# Patient Record
Sex: Male | Born: 1955
Health system: Southern US, Community
[De-identification: ages and names within clinical notes are randomized; demographics above are authoritative.]

## PROBLEM LIST (undated history)

## (undated) DIAGNOSIS — Z87442 Personal history of urinary calculi: Secondary | ICD-10-CM

## (undated) DIAGNOSIS — N400 Enlarged prostate without lower urinary tract symptoms: Secondary | ICD-10-CM

## (undated) DIAGNOSIS — M1990 Unspecified osteoarthritis, unspecified site: Secondary | ICD-10-CM

## (undated) DIAGNOSIS — M869 Osteomyelitis, unspecified: Secondary | ICD-10-CM

## (undated) DIAGNOSIS — E785 Hyperlipidemia, unspecified: Secondary | ICD-10-CM

## (undated) DIAGNOSIS — I10 Essential (primary) hypertension: Secondary | ICD-10-CM

## (undated) HISTORY — DX: Benign prostatic hyperplasia without lower urinary tract symptoms: N40.0

## (undated) HISTORY — PX: TONSILLECTOMY: SUR1361

## (undated) HISTORY — DX: Hyperlipidemia, unspecified: E78.5

## (undated) HISTORY — PX: JOINT REPLACEMENT: SHX530

## (undated) HISTORY — DX: Unspecified osteoarthritis, unspecified site: M19.90

---

## 2003-12-20 ENCOUNTER — Encounter: Admission: RE | Admit: 2003-12-20 | Discharge: 2003-12-20 | Payer: Self-pay | Admitting: Emergency Medicine

## 2004-01-24 ENCOUNTER — Encounter: Admission: RE | Admit: 2004-01-24 | Discharge: 2004-01-24 | Payer: Self-pay | Admitting: Emergency Medicine

## 2004-10-20 ENCOUNTER — Encounter: Admission: RE | Admit: 2004-10-20 | Discharge: 2004-10-20 | Payer: Self-pay | Admitting: Emergency Medicine

## 2004-12-22 ENCOUNTER — Ambulatory Visit (HOSPITAL_COMMUNITY): Admission: RE | Admit: 2004-12-22 | Discharge: 2004-12-22 | Payer: Self-pay | Admitting: General Surgery

## 2005-04-23 ENCOUNTER — Encounter: Admission: RE | Admit: 2005-04-23 | Discharge: 2005-04-23 | Payer: Self-pay | Admitting: Emergency Medicine

## 2005-06-06 ENCOUNTER — Emergency Department (HOSPITAL_COMMUNITY): Admission: EM | Admit: 2005-06-06 | Discharge: 2005-06-06 | Payer: Self-pay | Admitting: Emergency Medicine

## 2006-03-07 ENCOUNTER — Encounter: Admission: RE | Admit: 2006-03-07 | Discharge: 2006-03-07 | Payer: Self-pay | Admitting: Emergency Medicine

## 2006-05-13 ENCOUNTER — Emergency Department (HOSPITAL_COMMUNITY): Admission: EM | Admit: 2006-05-13 | Discharge: 2006-05-13 | Payer: Self-pay | Admitting: Emergency Medicine

## 2006-06-03 ENCOUNTER — Encounter: Admission: RE | Admit: 2006-06-03 | Discharge: 2006-06-03 | Payer: Self-pay | Admitting: Emergency Medicine

## 2007-11-06 ENCOUNTER — Ambulatory Visit: Payer: Self-pay | Admitting: Gastroenterology

## 2007-11-10 ENCOUNTER — Encounter: Admission: RE | Admit: 2007-11-10 | Discharge: 2007-11-10 | Payer: Self-pay | Admitting: Emergency Medicine

## 2007-11-19 ENCOUNTER — Ambulatory Visit: Payer: Self-pay | Admitting: Gastroenterology

## 2008-02-02 ENCOUNTER — Encounter: Admission: RE | Admit: 2008-02-02 | Discharge: 2008-02-02 | Payer: Self-pay | Admitting: Emergency Medicine

## 2008-04-13 ENCOUNTER — Ambulatory Visit (HOSPITAL_COMMUNITY): Admission: RE | Admit: 2008-04-13 | Discharge: 2008-04-14 | Payer: Self-pay | Admitting: Neurosurgery

## 2009-12-05 ENCOUNTER — Emergency Department (HOSPITAL_COMMUNITY): Admission: EM | Admit: 2009-12-05 | Discharge: 2009-12-05 | Payer: Self-pay | Admitting: Emergency Medicine

## 2010-06-08 ENCOUNTER — Inpatient Hospital Stay (HOSPITAL_COMMUNITY): Admission: RE | Admit: 2010-06-08 | Discharge: 2010-06-11 | Payer: Self-pay | Admitting: Orthopedic Surgery

## 2011-01-04 LAB — PROTIME-INR
INR: 1.14 (ref 0.00–1.49)
INR: 1.66 — ABNORMAL HIGH (ref 0.00–1.49)
Prothrombin Time: 14.8 seconds (ref 11.6–15.2)
Prothrombin Time: 30.6 seconds — ABNORMAL HIGH (ref 11.6–15.2)

## 2011-01-04 LAB — GLUCOSE, CAPILLARY
Glucose-Capillary: 109 mg/dL — ABNORMAL HIGH (ref 70–99)
Glucose-Capillary: 114 mg/dL — ABNORMAL HIGH (ref 70–99)
Glucose-Capillary: 114 mg/dL — ABNORMAL HIGH (ref 70–99)
Glucose-Capillary: 115 mg/dL — ABNORMAL HIGH (ref 70–99)
Glucose-Capillary: 117 mg/dL — ABNORMAL HIGH (ref 70–99)
Glucose-Capillary: 120 mg/dL — ABNORMAL HIGH (ref 70–99)
Glucose-Capillary: 123 mg/dL — ABNORMAL HIGH (ref 70–99)
Glucose-Capillary: 136 mg/dL — ABNORMAL HIGH (ref 70–99)
Glucose-Capillary: 150 mg/dL — ABNORMAL HIGH (ref 70–99)

## 2011-01-04 LAB — TYPE AND SCREEN
ABO/RH(D): O POS
Antibody Screen: NEGATIVE

## 2011-01-04 LAB — ABO/RH: ABO/RH(D): O POS

## 2011-01-04 LAB — HEMOGLOBIN AND HEMATOCRIT, BLOOD
HCT: 31.2 % — ABNORMAL LOW (ref 39.0–52.0)
HCT: 36.3 % — ABNORMAL LOW (ref 39.0–52.0)
Hemoglobin: 12.7 g/dL — ABNORMAL LOW (ref 13.0–17.0)

## 2011-01-05 LAB — COMPREHENSIVE METABOLIC PANEL
ALT: 17 U/L (ref 0–53)
AST: 21 U/L (ref 0–37)
Calcium: 9.2 mg/dL (ref 8.4–10.5)
GFR calc Af Amer: >60 mL/min (ref >60)
Sodium: 139 mEq/L (ref 135–145)
Total Protein: 6.6 g/dL (ref 6.0–8.3)

## 2011-01-05 LAB — DIFFERENTIAL
Eosinophils Absolute: 0.3 10*3/uL (ref 0.0–0.7)
Eosinophils Relative: 3 % (ref 0–5)
Lymphs Abs: 3.3 10*3/uL (ref 0.7–4.0)
Monocytes Relative: 5 % (ref 3–12)

## 2011-01-05 LAB — URINALYSIS, ROUTINE W REFLEX MICROSCOPIC
Ketones, ur: NEGATIVE mg/dL
Nitrite: NEGATIVE
Protein, ur: NEGATIVE mg/dL
pH: 6.5 (ref 5.0–8.0)

## 2011-01-05 LAB — MRSA CULTURE

## 2011-01-05 LAB — SURGICAL PCR SCREEN

## 2011-01-05 LAB — CBC
Hemoglobin: 13.6 g/dL (ref 13.0–17.0)
MCHC: 35 g/dL (ref 30.0–36.0)
RDW: 12.8 % (ref 11.5–15.5)

## 2011-01-05 LAB — PROTIME-INR: INR: 1.03 (ref 0.00–1.49)

## 2011-01-10 LAB — POCT I-STAT, CHEM 8
BUN: 7 mg/dL (ref 6–23)
Calcium, Ion: 1.17 mmol/L (ref 1.12–1.32)
Chloride: 105 mEq/L (ref 96–112)
Potassium: 4 mEq/L (ref 3.5–5.1)

## 2011-01-10 LAB — PROTIME-INR
INR: 1.03 (ref 0.00–1.49)
Prothrombin Time: 13.4 seconds (ref 11.6–15.2)

## 2011-01-10 LAB — APTT: aPTT: 33 seconds (ref 24–37)

## 2011-01-10 LAB — POCT CARDIAC MARKERS: Troponin i, poc: <0.05 ng/mL (ref 0.00–0.09)

## 2011-03-06 NOTE — Op Note (Signed)
[name], LANT              ACCOUNT NO.:  [id number]   MEDICAL RECORD NO.:  [id number]          PATIENT TYPE:  OIB   LOCATION:  3535                         FACILITY:  [facility]   PHYSICIAN:  Hilda Lias, M.D.   DATE OF BIRTH:  14-Mar-1956   DATE OF PROCEDURE:  04/13/2008  DATE OF DISCHARGE:                               OPERATIVE REPORT   PREOPERATIVE DIAGNOSES:  C3-C4 stenosis with myelopathy, gliosis of the  spinal cord, and status post fusion C4-C7 11 years ago.   POSTOPERATIVE DIAGNOSES:  C3-C4 stenosis with myelopathy, gliosis of the  spinal cord, and status post fusion C4-C7 11 years ago.   PROCEDURE:  Anterior C3-C4 diskectomy, decompression of the spinal cord,  foraminotomy, interbody fusion using Zero plate, microscope, and  autograft.   SURGEON:  Hilda Lias, MD.   ASSISTANT:  Coletta Memos, MD.   CLINICAL HISTORY:  Mr. Bagot is a gentleman who underwent fusion 11  years ago at the level of C4-C7.  Now, he had been complaining of  weakness,  difficulty walking, and x-rays show severe stenosis at the C4  with changes in the spinal cord.  Surgery was advised.   PROCEDURE:  The patient was taken to the OR and after intubation, the  left side of the neck was cleaned with DuraPrep.  A transverse incision  was done through the skin and subcutaneous tissue.  Dissection was  carried out.  The patient has a large submaximal gland.  Retraction was  made, and we were able to dissect until we saw the upper part of the  plate.  Immediately, we opened the anterior ligament and with a  microscope, we did a total gross diskectomy.  The patient had quite a  bit of disruption of the posterior ligament with fragment going into the  spinal cord itself.  Decompression was achieved.  Foraminotomy was done.  Then, the endplates were drilled.  No regular plate was used because  there was almost no more space for new plate.  Then, we went ahead and  we proceeded with the insertion  of 5-0 Zero plate about 9 mm with  autograft and bone markers inside.  Four screws were inserted.  Lateral  cervical spine showed good position of the plate and screws.  From then  on, the area was irrigated.  We accomplished good hemostasis.  Then, the  wound was closed with Vicryl and Steri-Strips.           ______________________________  Hilda Lias, M.D.     EB/MEDQ  D:  04/13/2008  T:  04/14/2008  Job:  [id number]

## 2011-03-06 NOTE — H&P (Signed)
[name], [name]              ACCOUNT NO.:  [id number]   MEDICAL RECORD NO.:  [id number]          PATIENT TYPE:  OIB   LOCATION:  3535                         FACILITY:  [facility]   PHYSICIAN:  Hilda Lias, M.D.   DATE OF BIRTH:  July 04, 1956   DATE OF ADMISSION:  04/13/2008  DATE OF DISCHARGE:                              HISTORY & PHYSICAL   ROOM NO:  Pending.   HISTORY:  The patient is a gentleman who about 11 years ago underwent  cervical fusion from C4 down to C7.  He came to my office about 8 weeks  ago complaining of weakness, numbness in hands, and numbness in the legs  for more than 18 months, which is getting worse for the past 5 months.  He noticed the hands and the legs are quite numb and he has problem with  balance.  He had an MRI, and he has seen [place] for evaluation.   PAST MEDICAL HISTORY:  Anterior cervical fusion in 1998 from C3 down to  C7 and right knee surgery.  He is not allergic to any medication.   SOCIAL HISTORY:  Does not drink.  He smokes a pack of cigarette.   FAMILY HISTORY:  Unremarkable.   REVIEW OF SYSTEMS:  Positive for arthritis, neck pain, weakness, and  high cholesterol.   PHYSICAL EXAMINATION:  HEAD, EARS, NOSE, AND THROAT:  Normal.  NECK:  He has a scar anteriorly.  He has some discomfort with  lateralization.  LUNGS:  Clear.  HEART:  Sounds are normal.  EXTREMITIES:  Normal pulses.  NEURO:  He has hyperflexion with Babinski on the right side.  He has  numbness, which mostly compromise the upper and lower extremity, and he  has difficulty walking in a straight line.   DIAGNOSTIC IMAGING:  The MRI showed that he has severe stenosis at the  level of L3-L4 with gliosis within the spinal cord itself.  The lumbar  spine x-ray showed lumbar stenosis from L2 down to L5. Clinically, the  patient had C3-C4 herniated disk stenosis with cervical myelopathy and  gliosis.  Lumbar stenosis.   RECOMMENDATIONS:  The patient is being admitted for  surgery.  The  procedure would be anterior diskectomy at the level of L3-L4 with  decompression of the spinal cord.  He knows about the risks including  paralysis, weakness, no improve whatsoever, damage to the esophagus,  damage to the trachea, and also the numbness that may not improve  secondary to the damage of the spinal cord itself.           ______________________________  Hilda Lias, M.D.    EB/MEDQ  D:  04/13/2008  T:  04/14/2008  Job:  [id number]

## 2011-03-09 NOTE — Op Note (Signed)
[name], [name]              ACCOUNT NO.:  [id number]   MEDICAL RECORD NO.:  [id number]          PATIENT TYPE:  AMB   LOCATION:  DAY                          FACILITY:  [facility]   PHYSICIAN:  Adolph Pollack, M.D.DATE OF BIRTH:  Nov 10, 1955   DATE OF PROCEDURE:  DATE OF DISCHARGE:                                 OPERATIVE REPORT   PREOPERATIVE DIAGNOSES:  Bilateral inguinal hernias with right side being  recurrent.   POSTOPERATIVE DIAGNOSES:  Bilateral inguinal hernias with right side being  recurrent.   PROCEDURE:  Laparoscopic repair of bilateral inguinal hernias with right  side being recurrent.   SURGEON:  Adolph Pollack, M.D.   ANESTHESIA:  General.   INDICATIONS:  This 55 year old male is having some pain in his groins was  found to have a left inguinal hernia by Dr. Tresa Watson. [name] was sent to our office  and both Dr. Zachery Watson and I palpated a recurrent right inguinal hernia.  [name]  now presents for laparoscopic repair of his hernias.   The procedure and the risks were discussed with him preoperatively.   TECHNIQUE:  [name] was seen in the holding area and then brought to the  operating room, placed supine on the operating room and general anesthetic  was administered. Before coming to the operating, [name] voided. The lower  abdominal wall and groin hair was clipped. The area was then sterilely  prepped and draped. Dilute Marcaine solution was infiltrated in the  subumbilical region and a small transverse subumbilical incision was made  through the skin and subcutaneous tissue. Using blunt dissection, I  exposed  the left anterior rectus sheath and made a small incision in it. The  underlying rectus muscle was swept laterally exposing the posterior rectus  sheath. A balloon dissection device was then placed in the extraperitoneal  space and under laparoscopic vision, the wound dissection was performed.   The balloon dissection device was removed and trocars placed in  the  extraperitoneal space.  CO2 gas was insufflated. Under laparoscopic vision,  two 5 mm trocars were placed in the lower midline. Using blunt dissection,  Cooper's ligament was exposed bilaterally. Beginning on the left side in  order to try and reduce the extraperitoneal fatty contents from it. I then  dissected the fibrofatty tissue away from the anterior and lateral abdominal  walls using blunt dissection up to the level of the umbilicus. The spermatic  cord was isolated and a window created around it. The peritoneum was  stripped from the cord back to the level of the umbilicus. No indirect  hernia was noted, however.   I then approached the right side. I noted what appeared to be recurrent  direct hernia and I reduced some of the fatty contents from this. I then  used blunt dissection to dissect the fibrofatty tissue away from the  anterior and lateral abdominal walls. The peritoneum was stripped off the  cord to the level of the umbilicus. A very small tear was made in the  peritoneum and I did not think this needed to be repaired. We did get a  pneumoperitoneum and I inserted a Veress needle just next to the umbilical  trocar to evacuate some of the pneumoperitoneum. This allowed for continued  good vision of the extraperitoneal space.   A 5 x 6 inch piece of mesh was then placed into the left extraperitoneal  space with a partial longitudinal slit cut into it. It was positioned  appropriately with the two tails wrapped around the cord. The mesh then  anchored to Cooper's ligament, the anterior and lateral abdominal walls with  spiral tacks. This provided for adequate coverage of the direct and indirect  internal spaces.   A similar size piece of mesh with a partial longitudinal slit cut into it  was then placed the right extraperitoneal space in position. It was anchored  to Cooper's ligament, the anterior and lateral abdominal walls with spiral  tacks. The two tails were  wrapped around the cord. It more than adequately  covered the femoral,  indirect and direct spaces.   Following this, I held the inferior lateral aspect of both pieces of mesh  down with a blunt instrument and released the CO2 gas. I then removed the  instruments and the trocars. The left anterior rectus sheath defect was  closed with her interrupted #0 Vicryl suture.  The skin incisions were  closed with 4-0 Monocryl subcuticular stitches followed by Steri-Strips and  sterile dressings.   [name] tolerated the procedure well without apparent complications and was taken  to the recovery in satisfactory condition.      TJR/MEDQ  D:  12/22/2004  T:  12/22/2004  Job:  [id number]   cc:   [name], M.D.  [address]  [place]  [place] [address]  Fax: [phone]

## 2011-05-16 IMAGING — CR DG PORTABLE PELVIS
1 series · 1 of 1 positions shown · non-contrast
Comparison: None.

CLINICAL DATA: Left hip replacement

PORTABLE PELVIS

[series [date]]
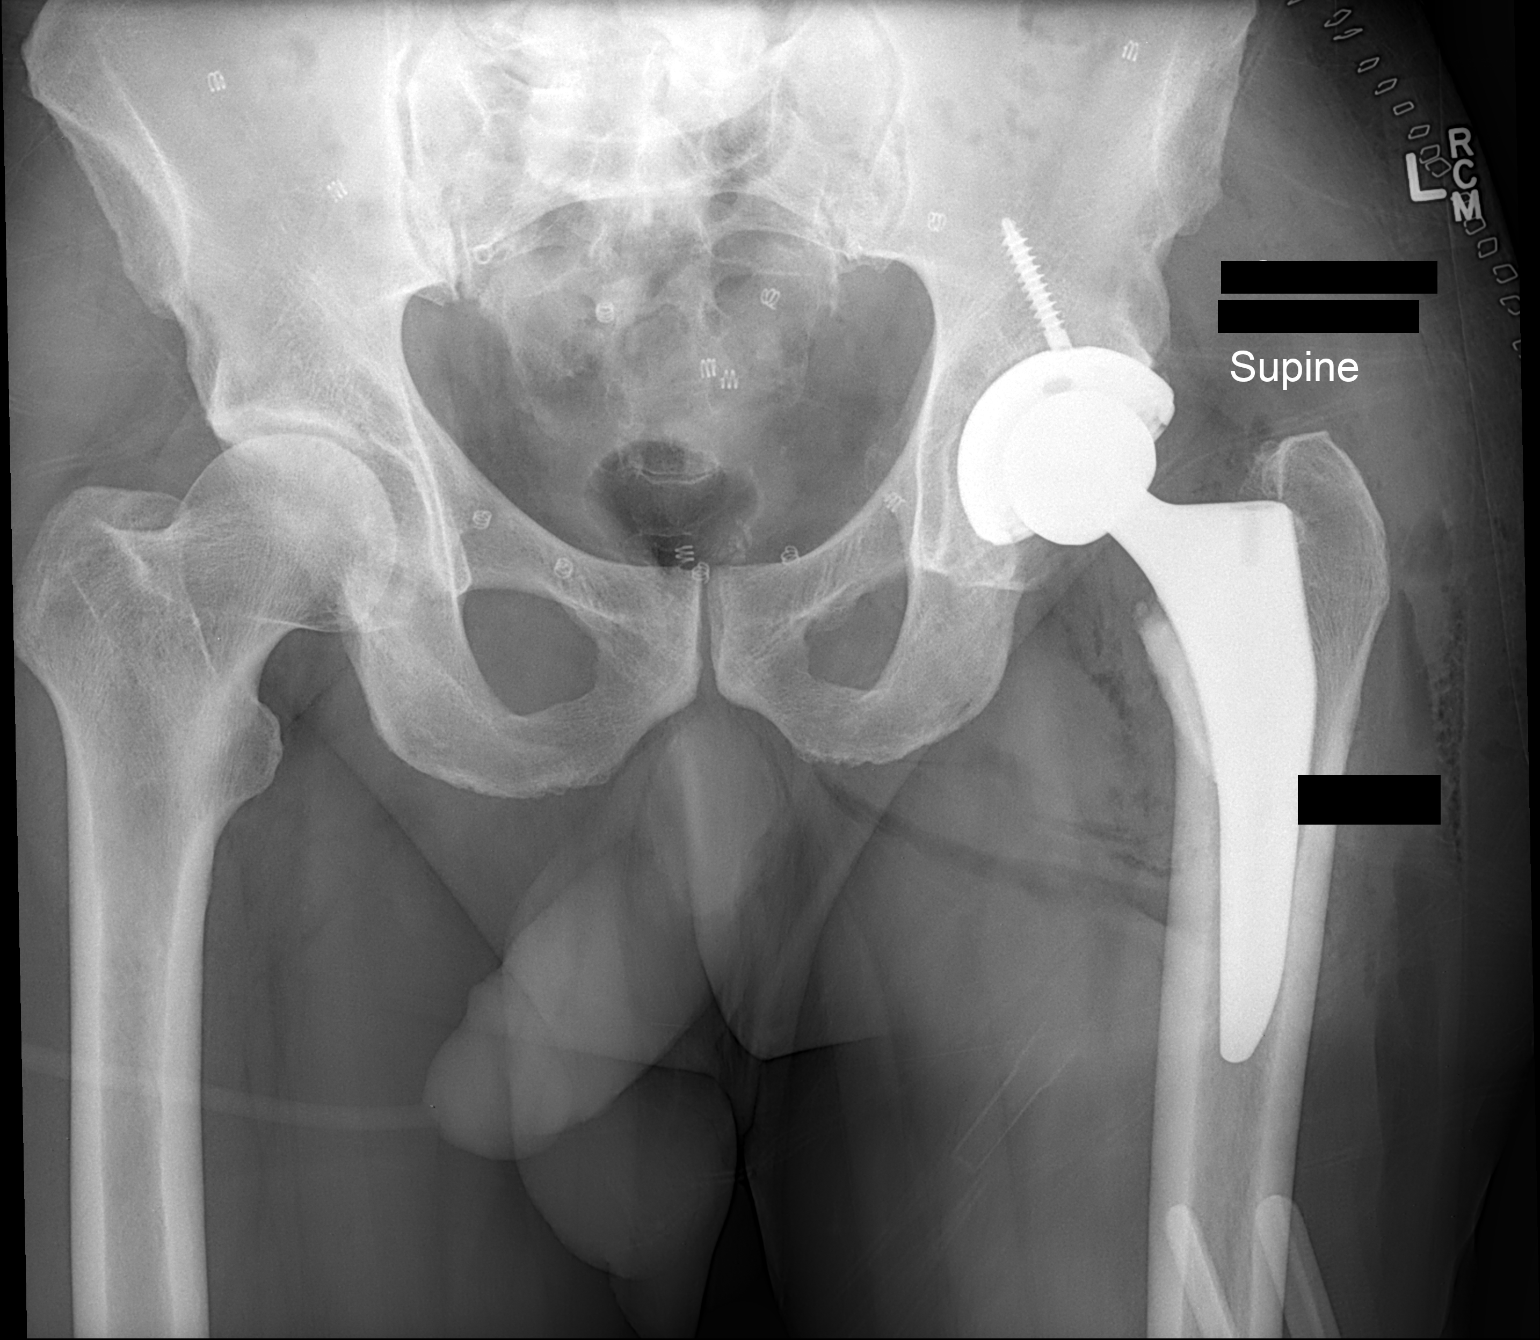

[1 of 1 positions shown; findings below may reference images not displayed]

FINDINGS: There is a left total hip arthroplasty.  Femoral
component and acetabular component well seated.  No evidence of
fracture.
IMPRESSION: No evidence of complication following left hip arthroplasty.

## 2011-07-19 LAB — CBC
HCT: 41.3
Hemoglobin: 14.8
MCHC: 35.7
MCV: 92.6
Platelets: 270
RBC: 4.47
RDW: 12.3
WBC: 12 — ABNORMAL HIGH

## 2011-07-19 LAB — BASIC METABOLIC PANEL
BUN: 10
Calcium: 9.2
GFR calc non Af Amer: >60
Glucose, Bld: 100 — ABNORMAL HIGH
Potassium: 4.1

## 2015-08-26 ENCOUNTER — Encounter: Payer: Self-pay | Admitting: Gastroenterology

## 2016-07-31 NOTE — Progress Notes (Signed)
Surgery on 08/15/2016 .  Need orders in EPIC.  Thank You.

## 2016-08-10 ENCOUNTER — Encounter (HOSPITAL_COMMUNITY)
Admission: RE | Admit: 2016-08-10 | Discharge: 2016-08-10 | Disposition: A | Discharge status: Home / Self Care | Payer: BLUE CROSS/BLUE SHIELD | Source: Ambulatory Visit | Attending: Orthopedic Surgery | Admitting: Orthopedic Surgery

## 2016-08-10 ENCOUNTER — Encounter (HOSPITAL_COMMUNITY): Payer: Self-pay

## 2016-08-10 DIAGNOSIS — Z87442 Personal history of urinary calculi: Secondary | ICD-10-CM | POA: Diagnosis not present

## 2016-08-10 DIAGNOSIS — M1710 Unilateral primary osteoarthritis, unspecified knee: Secondary | ICD-10-CM | POA: Insufficient documentation

## 2016-08-10 DIAGNOSIS — Z01812 Encounter for preprocedural laboratory examination: Secondary | ICD-10-CM | POA: Insufficient documentation

## 2016-08-10 DIAGNOSIS — Z01818 Encounter for other preprocedural examination: Secondary | ICD-10-CM

## 2016-08-10 HISTORY — DX: Osteomyelitis, unspecified: M86.9

## 2016-08-10 HISTORY — DX: Unspecified osteoarthritis, unspecified site: M19.90

## 2016-08-10 HISTORY — DX: Personal history of urinary calculi: Z87.442

## 2016-08-10 LAB — PROTIME-INR
INR: 0.96
Prothrombin Time: 12.8 seconds (ref 11.4–15.2)

## 2016-08-10 LAB — COMPREHENSIVE METABOLIC PANEL
ALT: 22 U/L (ref 17–63)
AST: 19 U/L (ref 15–41)
Albumin: 4.1 g/dL (ref 3.5–5.0)
Alkaline Phosphatase: 81 U/L (ref 38–126)
Anion gap: 6 (ref 5–15)
BUN: 10 mg/dL (ref 6–20)
CO2: 24 mmol/L (ref 22–32)
Calcium: 9.3 mg/dL (ref 8.9–10.3)
Chloride: 107 mmol/L (ref 101–111)
Creatinine, Ser: 0.9 mg/dL (ref 0.61–1.24)
GFR calc Af Amer: >60 mL/min (ref >60)
GFR calc non Af Amer: >60 mL/min (ref >60)
Glucose, Bld: 98 mg/dL (ref 65–99)
Potassium: 3.8 mmol/L (ref 3.5–5.1)
Sodium: 137 mmol/L (ref 135–145)
Total Bilirubin: 0.7 mg/dL (ref 0.3–1.2)
Total Protein: 6.9 g/dL (ref 6.5–8.1)

## 2016-08-10 LAB — CBC WITH DIFFERENTIAL/PLATELET
Basophils Absolute: 0 10*3/uL (ref 0.0–0.1)
Basophils Relative: 0 %
Eosinophils Absolute: 0.2 10*3/uL (ref 0.0–0.7)
Eosinophils Relative: 2 %
HCT: 43.5 % (ref 39.0–52.0)
Hemoglobin: 15.4 g/dL (ref 13.0–17.0)
Lymphocytes Relative: 36 %
Lymphs Abs: 3.2 10*3/uL (ref 0.7–4.0)
MCH: 33.9 pg (ref 26.0–34.0)
MCHC: 35.4 g/dL (ref 30.0–36.0)
MCV: 95.8 fL (ref 78.0–100.0)
Monocytes Absolute: 0.8 10*3/uL (ref 0.1–1.0)
Monocytes Relative: 9 %
Neutro Abs: 4.9 10*3/uL (ref 1.7–7.7)
Neutrophils Relative %: 53 %
Platelets: 212 10*3/uL (ref 150–400)
RBC: 4.54 MIL/uL (ref 4.22–5.81)
RDW: 12 % (ref 11.5–15.5)
WBC: 9.1 10*3/uL (ref 4.0–10.5)

## 2016-08-10 LAB — SURGICAL PCR SCREEN
MRSA, PCR: NEGATIVE
Staphylococcus aureus: NEGATIVE

## 2016-08-10 LAB — URINALYSIS, ROUTINE W REFLEX MICROSCOPIC
Bilirubin Urine: NEGATIVE
Glucose, UA: NEGATIVE mg/dL
Ketones, ur: NEGATIVE mg/dL
Leukocytes, UA: NEGATIVE
Nitrite: NEGATIVE
Protein, ur: NEGATIVE mg/dL
Specific Gravity, Urine: 1.017 (ref 1.005–1.030)
pH: 7 (ref 5.0–8.0)

## 2016-08-10 LAB — URINE MICROSCOPIC-ADD ON
Bacteria, UA: NONE SEEN
Squamous Epithelial / LPF: NONE SEEN

## 2016-08-10 LAB — APTT: aPTT: 30 seconds (ref 24–36)

## 2016-08-10 NOTE — Progress Notes (Signed)
U/A and micro done 08/10/16 faxed via EPIC to dr Gladstone Lighter.

## 2016-08-10 NOTE — Progress Notes (Signed)
06/04/16- [place]- dr Dagmar Hait on chart  LOV, EKG and CXR from 06/04/16 on chart

## 2016-08-10 NOTE — Patient Instructions (Addendum)
[name]  08/10/2016   Your procedure is scheduled on: 08/15/2016    Report to [facility] Main  Entrance take [place]  elevators to 3rd floor to  [place] at   [place] AM.  Call this number if you have problems the morning of surgery [phone]   Remember: ONLY 1 PERSON MAY GO WITH YOU TO SHORT STAY TO GET  READY MORNING OF [place].  Do not eat food or drink liquids :After Midnight.     Take these medicines the morning of surgery with A SIP OF WATER: none  DO NOT TAKE ANY DIABETIC MEDICATIONS DAY OF YOUR SURGERY                               You may not have any metal on your body including hair pins and              piercings  Do not wear jewelry, , lotions, powders or perfumes, deodorant                          Men may shave face and neck.   Do not bring valuables to the hospital. [place].  Contacts, dentures or bridgework may not be worn into surgery.  Leave suitcase in the car. After surgery it may be brought to your room.    Special Instructions: N/A              Please read over the following fact sheets you were given: _____________________________________________________________________             [facility] - Preparing for Surgery Before surgery, you can play an important role.  Because skin is not sterile, your skin needs to be as free of germs as possible.  You can reduce the number of germs on your skin by washing with CHG (chlorahexidine gluconate) soap before surgery.  CHG is an antiseptic cleaner which kills germs and bonds with the skin to continue killing germs even after washing. Please DO NOT use if you have an allergy to CHG or antibacterial soaps.  If your skin becomes reddened/irritated stop using the CHG and inform your nurse when you arrive at Short Stay. Do not shave (including legs and underarms) for at least 48 hours prior to the first CHG shower.  You may  shave your face/neck. Please follow these instructions carefully:  1.  Shower with CHG Soap the night before surgery and the  morning of Surgery.  2.  If you choose to wash your hair, wash your hair first as usual with your  normal  shampoo.  3.  After you shampoo, rinse your hair and body thoroughly to remove the  shampoo.                           4.  Use CHG as you would any other liquid soap.  You can apply chg directly  to the skin and wash                       Gently with a scrungie or clean washcloth.  5.  Apply the CHG  Soap to your body ONLY FROM THE NECK DOWN.   Do not use on face/ open                           Wound or open sores. Avoid contact with eyes, ears mouth and genitals (private parts).                       Wash face,  Genitals (private parts) with your normal soap.             6.  Wash thoroughly, paying special attention to the area where your surgery  will be performed.  7.  Thoroughly rinse your body with warm water from the neck down.  8.  DO NOT shower/wash with your normal soap after using and rinsing off  the CHG Soap.                9.  Pat yourself dry with a clean towel.            10.  Wear clean pajamas.            11.  Place clean sheets on your bed the night of your first shower and do not  sleep with pets. Day of Surgery : Do not apply any lotions/deodorants the morning of surgery.  Please wear clean clothes to the hospital/surgery center.  FAILURE TO FOLLOW THESE INSTRUCTIONS MAY RESULT IN THE CANCELLATION OF YOUR SURGERY PATIENT SIGNATURE_________________________________  NURSE SIGNATURE__________________________________  ________________________________________________________________________  WHAT IS A BLOOD TRANSFUSION? Blood Transfusion Information  A transfusion is the replacement of blood or some of its parts. Blood is made up of multiple cells which provide different functions.  Red blood cells carry oxygen and are used for blood loss  replacement.  White blood cells fight against infection.  Platelets control bleeding.  Plasma helps clot blood.  Other blood products are available for specialized needs, such as hemophilia or other clotting disorders. BEFORE THE TRANSFUSION  Who gives blood for transfusions?   Healthy volunteers who are fully evaluated to make sure their blood is safe. This is blood bank blood. Transfusion therapy is the safest it has ever been in the practice of medicine. Before blood is taken from a donor, a complete history is taken to make sure that person has no history of diseases nor engages in risky social behavior (examples are intravenous drug use or sexual activity with multiple partners). The donor's travel history is screened to minimize risk of transmitting infections, such as malaria. The donated blood is tested for signs of infectious diseases, such as HIV and hepatitis. The blood is then tested to be sure it is compatible with you in order to minimize the chance of a transfusion reaction. If you or a relative donates blood, this is often done in anticipation of surgery and is not appropriate for emergency situations. It takes many days to process the donated blood. RISKS AND COMPLICATIONS Although transfusion therapy is very safe and saves many lives, the main dangers of transfusion include:   Getting an infectious disease.  Developing a transfusion reaction. This is an allergic reaction to something in the blood you were given. Every precaution is taken to prevent this. The decision to have a blood transfusion has been considered carefully by your caregiver before blood is given. Blood is not given unless the benefits outweigh the risks. AFTER THE TRANSFUSION  Right after receiving a blood transfusion, you  will usually feel much better and more energetic. This is especially true if your red blood cells have gotten low (anemic). The transfusion raises the level of the red blood cells which  carry oxygen, and this usually causes an energy increase.  The nurse administering the transfusion will monitor you carefully for complications. HOME CARE INSTRUCTIONS  No special instructions are needed after a transfusion. You may find your energy is better. Speak with your caregiver about any limitations on activity for underlying diseases you may have. SEEK MEDICAL CARE IF:   Your condition is not improving after your transfusion.  You develop redness or irritation at the intravenous (IV) site. SEEK IMMEDIATE MEDICAL CARE IF:  Any of the following symptoms occur over the next 12 hours:  Shaking chills.  You have a temperature by mouth above 102 F (38.9 C), not controlled by medicine.  Chest, back, or muscle pain.  People around you feel you are not acting correctly or are confused.  Shortness of breath or difficulty breathing.  Dizziness and fainting.  You get a rash or develop hives.  You have a decrease in urine output.  Your urine turns a dark color or changes to pink, red, or brown. Any of the following symptoms occur over the next 10 days:  You have a temperature by mouth above 102 F (38.9 C), not controlled by medicine.  Shortness of breath.  Weakness after normal activity.  The white part of the eye turns yellow (jaundice).  You have a decrease in the amount of urine or are urinating less often.  Your urine turns a dark color or changes to pink, red, or brown. Document Released: 10/05/2000 Document Revised: 12/31/2011 Document Reviewed: 05/24/2008 ExitCare Patient Information 2014 [place].  _______________________________________________________________________  Incentive Spirometer  An incentive spirometer is a tool that can help keep your lungs clear and active. This tool measures how well you are filling your lungs with each breath. Taking long deep breaths may help reverse or decrease the chance of developing breathing (pulmonary) problems  (especially infection) following:  A long period of time when you are unable to move or be active. BEFORE THE PROCEDURE   If the spirometer includes an indicator to show your best effort, your nurse or respiratory therapist will set it to a desired goal.  If possible, sit up straight or lean slightly forward. Try not to slouch.  Hold the incentive spirometer in an upright position. INSTRUCTIONS FOR USE  1. Sit on the edge of your bed if possible, or sit up as far as you can in bed or on a chair. 2. Hold the incentive spirometer in an upright position. 3. Breathe out normally. 4. Place the mouthpiece in your mouth and seal your lips tightly around it. 5. Breathe in slowly and as deeply as possible, raising the piston or the ball toward the top of the column. 6. Hold your breath for 3-5 seconds or for as long as possible. Allow the piston or ball to fall to the bottom of the column. 7. Remove the mouthpiece from your mouth and breathe out normally. 8. Rest for a few seconds and repeat Steps 1 through 7 at least 10 times every 1-2 hours when you are awake. Take your time and take a few normal breaths between deep breaths. 9. The spirometer may include an indicator to show your best effort. Use the indicator as a goal to work toward during each repetition. 10. After each set of 10 deep breaths, practice  coughing to be sure your lungs are clear. If you have an incision (the cut made at the time of surgery), support your incision when coughing by placing a pillow or rolled up towels firmly against it. Once you are able to get out of bed, walk around indoors and cough well. You may stop using the incentive spirometer when instructed by your caregiver.  RISKS AND COMPLICATIONS  Take your time so you do not get dizzy or light-headed.  If you are in pain, you may need to take or ask for pain medication before doing incentive spirometry. It is harder to take a deep breath if you are having  pain. AFTER USE  Rest and breathe slowly and easily.  It can be helpful to keep track of a log of your progress. Your caregiver can provide you with a simple table to help with this. If you are using the spirometer at home, follow these instructions: [place] IF:   You are having difficultly using the spirometer.  You have trouble using the spirometer as often as instructed.  Your pain medication is not giving enough relief while using the spirometer.  You develop fever of 100.5 F (38.1 C) or higher. SEEK IMMEDIATE MEDICAL CARE IF:   You cough up bloody sputum that had not been present before.  You develop fever of 102 F (38.9 C) or greater.  You develop worsening pain at or near the incision site. MAKE SURE YOU:   Understand these instructions.  Will watch your condition.  Will get help right away if you are not doing well or get worse. Document Released: 02/18/2007 Document Revised: 12/31/2011 Document Reviewed: 04/21/2007 [facility] Patient Information 2014 [place], [place].   ________________________________________________________________________

## 2016-08-14 NOTE — H&P (Signed)
TOTAL KNEE ADMISSION H&P  Patient is being admitted for right total knee arthroplasty.  Subjective:  Chief Complaint:right knee pain.  HPI: [name], 60 y.o. male, has a history of pain and functional disability in the right knee due to arthritis and has failed non-surgical conservative treatments for greater than 12 weeks to includeNSAID's and/or analgesics, corticosteriod injections, flexibility and strengthening excercises and activity modification.  Onset of symptoms was gradual, starting 2 years ago with gradually worsening course since that time. The patient noted no past surgery on the right knee(s).  Patient currently rates pain in the right knee(s) at 8 out of 10 with activity. Patient has night pain, worsening of pain with activity and weight bearing, pain that interferes with activities of daily living, pain with passive range of motion, crepitus and joint swelling.  Patient has evidence of periarticular osteophytes and joint space narrowing by imaging studies. There is no active infection.   Past Medical History:  Diagnosis Date  . Arthritis   . Bone infection [facility])    age 30 - ? staph   . History of kidney stones    as a teenager     Past Surgical History:  Procedure Laterality Date  . JOINT REPLACEMENT     left hip - 20008      Current Outpatient Prescriptions:  .  ibuprofen (ADVIL,MOTRIN) 200 MG tablet, Take 600 mg by mouth 2 (two) times daily., Disp: , Rfl:    Social History  Substance Use Topics  . Smoking status: Current Every Day Smoker    Packs/day: 0.50    Years: 40.00  . Smokeless tobacco: Never Used  . Alcohol use Yes     Comment: glass of wine on weekends       Review of Systems  Constitutional: Negative.   HENT: Negative.   Eyes: Negative.   Respiratory: Negative.   Cardiovascular: Negative.   Gastrointestinal: Negative.   Genitourinary: Negative.   Musculoskeletal: Positive for joint pain and myalgias. Negative for back pain, falls and  neck pain.  Skin: Negative.   Neurological: Negative.   Endo/Heme/Allergies: Negative.   Psychiatric/Behavioral: Negative.     Objective:  Physical Exam  Constitutional: He is oriented to person, place, and time. He appears well-developed. No distress.  Overweight  HENT:  Head: Normocephalic and atraumatic.  Right Ear: External ear normal.  Left Ear: External ear normal.  Nose: Nose normal.  Mouth/Throat: Oropharynx is clear and moist.  Eyes: Conjunctivae and EOM are normal.  Neck: Normal range of motion. Neck supple.  Cardiovascular: Normal rate, regular rhythm, normal heart sounds and intact distal pulses.   No murmur heard. Respiratory: Effort normal and breath sounds normal. No respiratory distress. He has no wheezes.  GI: Soft. Bowel sounds are normal. He exhibits no distension. There is no tenderness.  Musculoskeletal:       Right hip: Normal.       Left hip: Normal.       Right knee: He exhibits decreased range of motion and swelling. He exhibits no effusion. Tenderness found. Medial joint line and lateral joint line tenderness noted.       Left knee: Normal.  Neurological: He is alert and oriented to person, place, and time. He has normal strength. No sensory deficit.  Skin: No rash noted. He is not diaphoretic. No erythema.  Psychiatric: He has a normal mood and affect. His behavior is normal.    Vitals  Weight: 217 lb Height: 68in Pulse: 64 (Regular)  BP: 150/80 (Sitting, Left Arm, Standard)  Imaging Review Plain radiographs demonstrate severe degenerative joint disease of the right knee(s). The overall alignment ismild varus. The bone quality appears to be good for age and reported activity level.  Assessment/Plan:  End stage primary osteoarthritis, right knee   The patient history, physical examination, clinical judgment of the provider and imaging studies are consistent with end stage degenerative joint disease of the right knee(s) and total knee  arthroplasty is deemed medically necessary. The treatment options including medical management, injection therapy arthroscopy and arthroplasty were discussed at length. The risks and benefits of total knee arthroplasty were presented and reviewed. The risks due to aseptic loosening, infection, stiffness, patella tracking problems, thromboembolic complications and other imponderables were discussed. The patient acknowledged the explanation, agreed to proceed with the plan and consent was signed. Patient is being admitted for inpatient treatment for surgery, pain control, PT, OT, prophylactic antibiotics, VTE prophylaxis, progressive ambulation and ADL's and discharge planning. The patient is planning to be discharged home with home health services   PCP: Dr. Dagmar Hait Therapy Plans: HHPT then outpatient at [facility] with girlfriend and sister   Ardeen Jourdain, [place]

## 2016-08-15 ENCOUNTER — Inpatient Hospital Stay (HOSPITAL_COMMUNITY): Payer: BLUE CROSS/BLUE SHIELD | Admitting: Anesthesiology

## 2016-08-15 ENCOUNTER — Inpatient Hospital Stay (HOSPITAL_COMMUNITY)
Admission: RE | Admit: 2016-08-15 | Discharge: 2016-08-17 | DRG: 470 | Disposition: A | Discharge status: Home Health | Payer: BLUE CROSS/BLUE SHIELD | Source: Ambulatory Visit | Attending: Orthopedic Surgery | Admitting: Orthopedic Surgery

## 2016-08-15 ENCOUNTER — Encounter (HOSPITAL_COMMUNITY)
Admission: RE | Disposition: A | Discharge status: Home Health | Payer: Self-pay | Source: Ambulatory Visit | Attending: Orthopedic Surgery

## 2016-08-15 ENCOUNTER — Encounter (HOSPITAL_COMMUNITY): Payer: Self-pay

## 2016-08-15 DIAGNOSIS — M24561 Contracture, right knee: Secondary | ICD-10-CM | POA: Diagnosis present

## 2016-08-15 DIAGNOSIS — Z96642 Presence of left artificial hip joint: Secondary | ICD-10-CM | POA: Diagnosis present

## 2016-08-15 DIAGNOSIS — Z87442 Personal history of urinary calculi: Secondary | ICD-10-CM

## 2016-08-15 DIAGNOSIS — Z791 Long term (current) use of non-steroidal anti-inflammatories (NSAID): Secondary | ICD-10-CM

## 2016-08-15 DIAGNOSIS — F17210 Nicotine dependence, cigarettes, uncomplicated: Secondary | ICD-10-CM | POA: Diagnosis present

## 2016-08-15 DIAGNOSIS — M1711 Unilateral primary osteoarthritis, right knee: Principal | ICD-10-CM | POA: Diagnosis present

## 2016-08-15 DIAGNOSIS — Z96651 Presence of right artificial knee joint: Secondary | ICD-10-CM

## 2016-08-15 HISTORY — PX: TOTAL KNEE ARTHROPLASTY: SHX125

## 2016-08-15 LAB — TYPE AND SCREEN
ABO/RH(D): O POS
Antibody Screen: NEGATIVE

## 2016-08-15 SURGERY — ARTHROPLASTY, KNEE, TOTAL
Anesthesia: Spinal | Laterality: Right

## 2016-08-15 MED ORDER — MIDAZOLAM HCL 5 MG/5ML IJ SOLN
INTRAMUSCULAR | Status: DC | PRN
  Administered 2016-08-15: 2 mg via INTRAVENOUS

## 2016-08-15 MED ORDER — HYDROMORPHONE HCL 1 MG/ML IJ SOLN: 0.2500 mg | INTRAMUSCULAR | Status: DC | PRN

## 2016-08-15 MED ORDER — ONDANSETRON HCL 4 MG/2ML IJ SOLN
INTRAMUSCULAR | Status: AC
  Filled 2016-08-15: qty 2

## 2016-08-15 MED ORDER — LIDOCAINE 2% (20 MG/ML) 5 ML SYRINGE
INTRAMUSCULAR | Status: AC
  Filled 2016-08-15: qty 5

## 2016-08-15 MED ORDER — MIDAZOLAM HCL 2 MG/2ML IJ SOLN
INTRAMUSCULAR | Status: AC
  Filled 2016-08-15: qty 2

## 2016-08-15 MED ORDER — LACTATED RINGERS IV SOLN
INTRAVENOUS | Status: DC
  Administered 2016-08-15 – 2016-08-16 (×2): via INTRAVENOUS

## 2016-08-15 MED ORDER — SODIUM CHLORIDE 0.9 % IJ SOLN
INTRAMUSCULAR | Status: DC | PRN
  Administered 2016-08-15: 20 mL

## 2016-08-15 MED ORDER — HYDROMORPHONE HCL 1 MG/ML IJ SOLN
1.0000 mg | INTRAMUSCULAR | Status: DC | PRN
  Administered 2016-08-15 – 2016-08-16 (×3): 1 mg via INTRAVENOUS
  Filled 2016-08-15 (×3): qty 1

## 2016-08-15 MED ORDER — ONDANSETRON HCL 4 MG PO TABS: 4.0000 mg | ORAL_TABLET | Freq: Four times a day (QID) | ORAL | Status: DC | PRN

## 2016-08-15 MED ORDER — ONDANSETRON HCL 4 MG/2ML IJ SOLN
INTRAMUSCULAR | Status: DC | PRN
  Administered 2016-08-15: 4 mg via INTRAVENOUS

## 2016-08-15 MED ORDER — CEFAZOLIN IN D5W 1 GM/50ML IV SOLN
1.0000 g | Freq: Four times a day (QID) | INTRAVENOUS | Status: AC
  Administered 2016-08-15 (×2): 1 g via INTRAVENOUS
  Filled 2016-08-15 (×2): qty 50

## 2016-08-15 MED ORDER — ONDANSETRON HCL 4 MG/2ML IJ SOLN: 4.0000 mg | Freq: Four times a day (QID) | INTRAMUSCULAR | Status: DC | PRN

## 2016-08-15 MED ORDER — BUPIVACAINE HCL (PF) 0.25 % IJ SOLN
INTRAMUSCULAR | Status: DC | PRN
  Administered 2016-08-15: 20 mL

## 2016-08-15 MED ORDER — CEFAZOLIN SODIUM-DEXTROSE 2-4 GM/100ML-% IV SOLN
2.0000 g | INTRAVENOUS | Status: AC
  Administered 2016-08-15: 2 g via INTRAVENOUS
  Filled 2016-08-15: qty 100

## 2016-08-15 MED ORDER — FENTANYL CITRATE (PF) 100 MCG/2ML IJ SOLN
INTRAMUSCULAR | Status: AC
  Filled 2016-08-15: qty 2

## 2016-08-15 MED ORDER — FERROUS SULFATE 325 (65 FE) MG PO TABS
325.0000 mg | ORAL_TABLET | Freq: Three times a day (TID) | ORAL | Status: DC
  Administered 2016-08-15 – 2016-08-17 (×5): 325 mg via ORAL
  Filled 2016-08-15 (×5): qty 1

## 2016-08-15 MED ORDER — BISACODYL 5 MG PO TBEC: 5.0000 mg | DELAYED_RELEASE_TABLET | Freq: Every day | ORAL | Status: DC | PRN

## 2016-08-15 MED ORDER — HYDROCODONE-ACETAMINOPHEN 5-325 MG PO TABS
1.0000 | ORAL_TABLET | ORAL | Status: DC | PRN
  Administered 2016-08-15 – 2016-08-16 (×3): 2 via ORAL
  Filled 2016-08-15 (×3): qty 2

## 2016-08-15 MED ORDER — PROPOFOL 10 MG/ML IV BOLUS
INTRAVENOUS | Status: AC
  Filled 2016-08-15: qty 20

## 2016-08-15 MED ORDER — SUCCINYLCHOLINE CHLORIDE 20 MG/ML IJ SOLN
INTRAMUSCULAR | Status: AC
  Filled 2016-08-15: qty 1

## 2016-08-15 MED ORDER — ALUM & MAG HYDROXIDE-SIMETH 200-200-20 MG/5ML PO SUSP: 30.0000 mL | ORAL | Status: DC | PRN

## 2016-08-15 MED ORDER — FENTANYL CITRATE (PF) 100 MCG/2ML IJ SOLN
INTRAMUSCULAR | Status: DC | PRN
  Administered 2016-08-15: 100 ug via INTRAVENOUS

## 2016-08-15 MED ORDER — BUPIVACAINE LIPOSOME 1.3 % IJ SUSP
INTRAMUSCULAR | Status: DC | PRN
  Administered 2016-08-15: 20 mL

## 2016-08-15 MED ORDER — CHLORHEXIDINE GLUCONATE 4 % EX LIQD: 60.0000 mL | Freq: Once | CUTANEOUS | Status: DC

## 2016-08-15 MED ORDER — BUPIVACAINE LIPOSOME 1.3 % IJ SUSP
20.0000 mL | Freq: Once | INTRAMUSCULAR | Status: DC
  Filled 2016-08-15: qty 20

## 2016-08-15 MED ORDER — SODIUM CHLORIDE 0.9 % IR SOLN
Status: DC | PRN
  Administered 2016-08-15: 500 mL

## 2016-08-15 MED ORDER — LACTATED RINGERS IV SOLN
INTRAVENOUS | Status: DC
  Administered 2016-08-15 (×3): via INTRAVENOUS

## 2016-08-15 MED ORDER — METHOCARBAMOL 1000 MG/10ML IJ SOLN
500.0000 mg | Freq: Four times a day (QID) | INTRAVENOUS | Status: DC | PRN
  Filled 2016-08-15: qty 5

## 2016-08-15 MED ORDER — ROCURONIUM BROMIDE 50 MG/5ML IV SOSY
PREFILLED_SYRINGE | INTRAVENOUS | Status: AC
  Filled 2016-08-15: qty 5

## 2016-08-15 MED ORDER — ACETAMINOPHEN 650 MG RE SUPP: 650.0000 mg | Freq: Four times a day (QID) | RECTAL | Status: DC | PRN

## 2016-08-15 MED ORDER — SODIUM CHLORIDE 0.9 % IJ SOLN
INTRAMUSCULAR | Status: AC
  Filled 2016-08-15: qty 20

## 2016-08-15 MED ORDER — BUPIVACAINE HCL (PF) 0.75 % IJ SOLN
INTRAMUSCULAR | Status: DC | PRN
  Administered 2016-08-15: 15 mg via INTRATHECAL

## 2016-08-15 MED ORDER — PROPOFOL 10 MG/ML IV BOLUS
INTRAVENOUS | Status: AC
  Filled 2016-08-15: qty 40

## 2016-08-15 MED ORDER — SODIUM CHLORIDE 0.9 % IR SOLN
Status: AC
  Filled 2016-08-15: qty 500000

## 2016-08-15 MED ORDER — PROMETHAZINE HCL 25 MG/ML IJ SOLN: 6.2500 mg | INTRAMUSCULAR | Status: DC | PRN

## 2016-08-15 MED ORDER — ACETAMINOPHEN 325 MG PO TABS: 650.0000 mg | ORAL_TABLET | Freq: Four times a day (QID) | ORAL | Status: DC | PRN

## 2016-08-15 MED ORDER — POLYETHYLENE GLYCOL 3350 17 G PO PACK: 17.0000 g | PACK | Freq: Every day | ORAL | Status: DC | PRN

## 2016-08-15 MED ORDER — FLEET ENEMA 7-19 GM/118ML RE ENEM: 1.0000 | ENEMA | Freq: Once | RECTAL | Status: DC | PRN

## 2016-08-15 MED ORDER — DEXAMETHASONE SODIUM PHOSPHATE 10 MG/ML IJ SOLN
INTRAMUSCULAR | Status: DC | PRN
  Administered 2016-08-15: 10 mg via INTRAVENOUS

## 2016-08-15 MED ORDER — PHENOL 1.4 % MT LIQD: 1.0000 | OROMUCOSAL | Status: DC | PRN

## 2016-08-15 MED ORDER — MENTHOL 3 MG MT LOZG
1.0000 | LOZENGE | OROMUCOSAL | Status: DC | PRN
Start: 2016-08-15 — End: 2016-08-17

## 2016-08-15 MED ORDER — PROPOFOL 500 MG/50ML IV EMUL
INTRAVENOUS | Status: DC | PRN
  Administered 2016-08-15: 100 ug/kg/min via INTRAVENOUS

## 2016-08-15 MED ORDER — RIVAROXABAN 10 MG PO TABS
10.0000 mg | ORAL_TABLET | Freq: Every day | ORAL | Status: DC
  Administered 2016-08-16 – 2016-08-17 (×2): 10 mg via ORAL
  Filled 2016-08-15 (×2): qty 1

## 2016-08-15 MED ORDER — CEFAZOLIN SODIUM-DEXTROSE 2-4 GM/100ML-% IV SOLN
INTRAVENOUS | Status: AC
  Filled 2016-08-15: qty 100

## 2016-08-15 MED ORDER — CELECOXIB 200 MG PO CAPS
200.0000 mg | ORAL_CAPSULE | Freq: Two times a day (BID) | ORAL | Status: DC
  Administered 2016-08-15 – 2016-08-17 (×5): 200 mg via ORAL
  Filled 2016-08-15 (×5): qty 1

## 2016-08-15 MED ORDER — BUPIVACAINE HCL (PF) 0.25 % IJ SOLN
INTRAMUSCULAR | Status: AC
  Filled 2016-08-15: qty 30

## 2016-08-15 MED ORDER — SODIUM CHLORIDE 0.9 % IV SOLN
1000.0000 mg | INTRAVENOUS | Status: AC
  Administered 2016-08-15: 1000 mg via INTRAVENOUS
  Filled 2016-08-15: qty 1100

## 2016-08-15 MED ORDER — METHOCARBAMOL 500 MG PO TABS
500.0000 mg | ORAL_TABLET | Freq: Four times a day (QID) | ORAL | Status: DC | PRN
  Administered 2016-08-15 – 2016-08-17 (×4): 500 mg via ORAL
  Filled 2016-08-15 (×4): qty 1

## 2016-08-15 MED ORDER — OXYCODONE-ACETAMINOPHEN 5-325 MG PO TABS
2.0000 | ORAL_TABLET | ORAL | Status: DC | PRN
  Administered 2016-08-15 – 2016-08-16 (×3): 2 via ORAL
  Filled 2016-08-15 (×3): qty 2

## 2016-08-15 MED ORDER — DEXAMETHASONE SODIUM PHOSPHATE 10 MG/ML IJ SOLN
INTRAMUSCULAR | Status: AC
  Filled 2016-08-15: qty 1

## 2016-08-15 SURGICAL SUPPLY — 64 items
BAG DECANTER FOR FLEXI CONT (MISCELLANEOUS) IMPLANT
BAG ZIPLOCK 12X15 (MISCELLANEOUS) IMPLANT
BANDAGE ACE 4X5 VEL STRL LF (GAUZE/BANDAGES/DRESSINGS) ×3 IMPLANT
BANDAGE ACE 6X5 VEL STRL LF (GAUZE/BANDAGES/DRESSINGS) ×3 IMPLANT
BANDAGE ELASTIC 4 VELCRO ST LF (GAUZE/BANDAGES/DRESSINGS) ×3 IMPLANT
BLADE SAG 18X100X1.27 (BLADE) ×3 IMPLANT
BLADE SAW SGTL 11.0X1.19X90.0M (BLADE) ×3 IMPLANT
BNDG COHESIVE 4X5 TAN NS LF (GAUZE/BANDAGES/DRESSINGS) ×6 IMPLANT
BNDG GAUZE ELAST 4 BULKY (GAUZE/BANDAGES/DRESSINGS) ×3 IMPLANT
BONE CEMENT GENTAMICIN (Cement) ×6 IMPLANT
CAP KNEE TOTAL 3 SIGMA ×3 IMPLANT
CEMENT BONE GENTAMICIN 40 (Cement) ×2 IMPLANT
CLOSURE WOUND 1/2 X4 (GAUZE/BANDAGES/DRESSINGS) ×1
CLOTH BEACON ORANGE TIMEOUT ST (SAFETY) ×3 IMPLANT
CUFF TOURN SGL QUICK 34 (TOURNIQUET CUFF) ×2
CUFF TRNQT CYL 34X4X40X1 (TOURNIQUET CUFF) ×1 IMPLANT
DECANTER SPIKE VIAL GLASS SM (MISCELLANEOUS) ×3 IMPLANT
DRAPE INCISE IOBAN 66X45 STRL (DRAPES) IMPLANT
DRAPE U-SHAPE 47X51 STRL (DRAPES) ×3 IMPLANT
DRSG ADAPTIC 3X8 NADH LF (GAUZE/BANDAGES/DRESSINGS) ×3 IMPLANT
DRSG PAD ABDOMINAL 8X10 ST (GAUZE/BANDAGES/DRESSINGS) ×6 IMPLANT
DURAPREP 26ML APPLICATOR (WOUND CARE) ×3 IMPLANT
ELECT REM PT RETURN 9FT ADLT (ELECTROSURGICAL) ×3
ELECTRODE REM PT RTRN 9FT ADLT (ELECTROSURGICAL) ×1 IMPLANT
EVACUATOR 1/8 PVC DRAIN (DRAIN) ×3 IMPLANT
FACESHIELD WRAPAROUND (MASK) ×3 IMPLANT
GAUZE SPONGE 4X4 12PLY STRL (GAUZE/BANDAGES/DRESSINGS) ×3 IMPLANT
GLOVE BIOGEL PI IND STRL 6.5 (GLOVE) ×1 IMPLANT
GLOVE BIOGEL PI IND STRL 8 (GLOVE) ×1 IMPLANT
GLOVE BIOGEL PI INDICATOR 6.5 (GLOVE) ×2
GLOVE BIOGEL PI INDICATOR 8 (GLOVE) ×2
GLOVE ECLIPSE 8.0 STRL XLNG CF (GLOVE) ×6 IMPLANT
GLOVE SURG SS PI 6.5 STRL IVOR (GLOVE) ×3 IMPLANT
GOWN STRL REUS W/TWL LRG LVL3 (GOWN DISPOSABLE) ×3 IMPLANT
GOWN STRL REUS W/TWL XL LVL3 (GOWN DISPOSABLE) ×3 IMPLANT
HANDPIECE INTERPULSE COAX TIP (DISPOSABLE) ×2
HEMOSTAT SPONGE AVITENE ULTRA (HEMOSTASIS) ×3 IMPLANT
IMMOBILIZER KNEE 20 (SOFTGOODS) ×3
IMMOBILIZER KNEE 20 THIGH 36 (SOFTGOODS) ×1 IMPLANT
MANIFOLD NEPTUNE II (INSTRUMENTS) ×3 IMPLANT
NEEDLE HYPO 21X1.5 SAFETY (NEEDLE) ×3 IMPLANT
NEEDLE HYPO 22GX1.5 SAFETY (NEEDLE) ×3 IMPLANT
NS IRRIG 1000ML POUR BTL (IV SOLUTION) IMPLANT
PACK TOTAL KNEE CUSTOM (KITS) ×3 IMPLANT
PAD ABD 8X10 STRL (GAUZE/BANDAGES/DRESSINGS) ×3 IMPLANT
PENCIL SMOKE EVAC W/HOLSTER (ELECTROSURGICAL) ×3 IMPLANT
POSITIONER SURGICAL ARM (MISCELLANEOUS) ×3 IMPLANT
SET HNDPC FAN SPRY TIP SCT (DISPOSABLE) ×1 IMPLANT
SET PAD KNEE POSITIONER (MISCELLANEOUS) ×3 IMPLANT
SPONGE LAP 18X18 X RAY DECT (DISPOSABLE) IMPLANT
STRIP CLOSURE SKIN 1/2X4 (GAUZE/BANDAGES/DRESSINGS) ×2 IMPLANT
SUT BONE WAX W31G (SUTURE) IMPLANT
SUT MNCRL AB 4-0 PS2 18 (SUTURE) ×3 IMPLANT
SUT VIC AB 1 CT1 27 (SUTURE) ×4
SUT VIC AB 1 CT1 27XBRD ANTBC (SUTURE) ×2 IMPLANT
SUT VIC AB 2-0 CT1 27 (SUTURE) ×6
SUT VIC AB 2-0 CT1 TAPERPNT 27 (SUTURE) ×3 IMPLANT
SUT VLOC 180 0 24IN GS25 (SUTURE) ×3 IMPLANT
SYR 20CC LL (SYRINGE) ×6 IMPLANT
TOWER CARTRIDGE SMART MIX (DISPOSABLE) ×3 IMPLANT
TRAY FOLEY W/METER SILVER 16FR (SET/KITS/TRAYS/PACK) ×3 IMPLANT
WATER STERILE IRR 1500ML POUR (IV SOLUTION) ×3 IMPLANT
WRAP KNEE MAXI GEL POST OP (GAUZE/BANDAGES/DRESSINGS) ×3 IMPLANT
YANKAUER SUCT BULB TIP 10FT TU (MISCELLANEOUS) ×3 IMPLANT

## 2016-08-15 NOTE — Anesthesia Preprocedure Evaluation (Signed)
Anesthesia Evaluation  Patient identified by MRN, date of birth, ID band Patient awake    Reviewed: Allergy & Precautions, NPO status , Patient's Chart, lab work & pertinent test results  Airway Mallampati: II  TM Distance: >3 FB Neck ROM: Full    Dental no notable dental hx.    Pulmonary neg pulmonary ROS, Current Smoker,    Pulmonary exam normal breath sounds clear to auscultation       Cardiovascular negative cardio ROS Normal cardiovascular exam Rhythm:Regular Rate:Normal     Neuro/Psych negative neurological ROS  negative psych ROS   GI/Hepatic negative GI ROS, Neg liver ROS,   Endo/Other  negative endocrine ROS  Renal/GU negative Renal ROS  negative genitourinary   Musculoskeletal  (+) Arthritis ,   Abdominal (+) + obese,   Peds negative pediatric ROS (+)  Hematology negative hematology ROS (+)   Anesthesia Other Findings   Reproductive/Obstetrics negative OB ROS                             Anesthesia Physical Anesthesia Plan  ASA: II  Anesthesia Plan: Spinal   Post-op Pain Management:    Induction: Intravenous  Airway Management Planned: Natural Airway  Additional Equipment:   Intra-op Plan: Utilization Of Total Body Hypothermia per surgeon request  Post-operative Plan:   Informed Consent: I have reviewed the patients History and Physical, chart, labs and discussed the procedure including the risks, benefits and alternatives for the proposed anesthesia with the patient or authorized representative who has indicated his/her understanding and acceptance.     Plan Discussed with: CRNA  Anesthesia Plan Comments: (Discussed risks and benefits of and differences between spinal and general. Discussed risks of spinal including headache, backache, failure, bleeding and hematoma, infection, and nerve damage. Patient consents to spinal. Questions answered. Coagulation studies  and platelet count acceptable.)        Anesthesia Quick Evaluation

## 2016-08-15 NOTE — Anesthesia Postprocedure Evaluation (Signed)
Anesthesia Post Note  Patient: [name]  Procedure(s) Performed: Procedure(s) (LRB): TOTAL KNEE ARTHROPLASTY (Right)  Patient location during evaluation: PACU Anesthesia Type: Spinal Level of consciousness: oriented and awake and alert Pain management: pain level controlled Vital Signs Assessment: post-procedure vital signs reviewed and stable Respiratory status: spontaneous breathing, respiratory function stable and patient connected to nasal cannula oxygen Cardiovascular status: blood pressure returned to baseline and stable Postop Assessment: no headache, no backache, spinal receding and patient able to bend at knees Anesthetic complications: no    Last Vitals:  Vitals:   08/15/16 1145 08/15/16 1208  BP: (!) 147/93 (!) 145/76  Pulse: (!) 55 (!) 55  Resp: 15 12  Temp: 36.4 C 36.8 C    Last Pain:  Vitals:   08/15/16 1145  TempSrc:   PainSc: 0-No pain                 Destry Dauber J

## 2016-08-15 NOTE — Interval H&P Note (Signed)
History and Physical Interval Note:  08/15/2016 7:17 AM  [name]  has presented today for surgery, with the diagnosis of RIGHT KNEE OA  The various methods of treatment have been discussed with the patient and family. After consideration of risks, benefits and other options for treatment, the patient has consented to  Procedure(s): TOTAL KNEE ARTHROPLASTY (Right) as a surgical intervention .  The patient's history has been reviewed, patient examined, no change in status, stable for surgery.  I have reviewed the patient's chart and labs.  Questions were answered to the patient's satisfaction.     Henri Guedes A

## 2016-08-15 NOTE — Discharge Instructions (Signed)

## 2016-08-15 NOTE — Transfer of Care (Signed)
Immediate Anesthesia Transfer of Care Note  Patient: [name]  Procedure(s) Performed: Procedure(s): TOTAL KNEE ARTHROPLASTY (Right)  Patient Location: PACU  Anesthesia Type:Spinal  Level of Consciousness: sedated  Airway & Oxygen Therapy: Patient Spontanous Breathing and Patient connected to face mask oxygen  Post-op Assessment: Report given to RN and Post -op Vital signs reviewed and stable  Post vital signs: Reviewed and stable  Last Vitals:  Vitals:   08/15/16 0630  BP: (!) 152/72  Pulse: 72  Resp: 18  Temp: 36.8 C    Last Pain:  Vitals:   08/15/16 0630  TempSrc: Oral  PainSc:          Complications: No apparent anesthesia complications

## 2016-08-15 NOTE — Progress Notes (Signed)
Dr. Delma Post in to see patient.

## 2016-08-15 NOTE — Progress Notes (Signed)
Dr. Delma Post made aware of patient's heart rates 42-52- and blood pressures- also made aware patient very sleepy-arousable- has periods of apnea

## 2016-08-15 NOTE — Brief Op Note (Signed)
08/15/2016  9:08 AM  PATIENT:  Abdulhamid L Napoles  60 y.o. male  PRE-OPERATIVE DIAGNOSIS:  RIGHT KNEE Primary  OA with Flexion Contracture.  POST-OPERATIVE DIAGNOSIS:  RIGHT KNEE Primary  OA with Flexion Contracture.  PROCEDURE:  Procedure(s): TOTAL KNEE ARTHROPLASTY (Right) and release of Flexion Contractures.  SURGEON:  Surgeon(s) and Role:    * Latanya Maudlin, MD - Primary  PHYSICIAN ASSISTANT: Ardeen Jourdain PA  ASSISTANTS: Ardeen Jourdain PA  ANESTHESIA:   spinal  EBL:  Total I/O In: 1000 [I.V.:1000] Out: 200 [Urine:200]  BLOOD ADMINISTERED:none  DRAINS: (one) Hemovact drain(s) in the Right Knee with  Suction Open   LOCAL MEDICATIONS USED:  MARCAINE 20cc of 0.50% plain and Exparel 20cc mixed with 20cc of Normal Saline.    SPECIMEN:  No Specimen  DISPOSITION OF SPECIMEN:  N/A  COUNTS:  YES  TOURNIQUET:  * Missing tourniquet times found for documented tourniquets in log:  [id number] *  DICTATION: .Other Dictation: Dictation Number [id number]  PLAN OF CARE: Admit to inpatient   PATIENT DISPOSITION:  Stable in OR   Delay start of Pharmacological VTE agent (>24hrs) due to surgical blood loss or risk of bleeding: yes

## 2016-08-15 NOTE — Anesthesia Procedure Notes (Addendum)
Spinal  Patient location during procedure: OR Start time: 08/15/2016 7:30 AM End time: 08/15/2016 7:32 AM Staffing Resident/CRNA: Harle Stanford R Performed: resident/CRNA  Preanesthetic Checklist Completed: patient identified, site marked, surgical consent, pre-op evaluation, timeout performed, IV checked, risks and benefits discussed and monitors and equipment checked Spinal Block Patient position: sitting Prep: Betadine Patient monitoring: heart rate, cardiac monitor, continuous pulse ox and blood pressure Approach: midline Location: L3-4 Injection technique: single-shot Needle Needle type: Pencan  Needle gauge: 24 G Needle length: 10 cm Needle insertion depth: 7 cm Assessment Sensory level: T6 Additional Notes Timeout performed. SAB kit date checked. SAB without difficulty

## 2016-08-15 NOTE — Evaluation (Signed)
Physical Therapy Evaluation Patient Details Name: KY CLUM MRN: [id number] DOB: 12/27/1955 Today's Date: 08/15/2016   History of Present Illness  Pt s/p R TKR and with hx of L THR (08)  Clinical Impression  Pt s/p R TKR presents with decreased R LE strength/ROM and post op pain limiting functional mobility.  Pt should progress to dc home with assist of "Joy" and HHPT follow up.    Follow Up Recommendations Home health PT    Equipment Recommendations  None recommended by PT    Recommendations for Other Services OT consult     Precautions / Restrictions Precautions Precautions: Knee;Fall Required Braces or Orthoses: Knee Immobilizer - Right Knee Immobilizer - Right: Discontinue once straight leg raise with < 10 degree lag Restrictions Weight Bearing Restrictions: No Other Position/Activity Restrictions: WBAT      Mobility  Bed Mobility Overal bed mobility: Needs Assistance Bed Mobility: Supine to Sit     Supine to sit: Min assist     General bed mobility comments: cues for sequence and use of L LE to self assist  Transfers Overall transfer level: Needs assistance Equipment used: Rolling walker (2 wheeled) Transfers: Sit to/from Stand Sit to Stand: Min guard         General transfer comment: cues for LE management and use of UEs to self assist  Ambulation/Gait Ambulation/Gait assistance: Min assist Ambulation Distance (Feet): 48 Feet Assistive device: Rolling walker (2 wheeled) Gait Pattern/deviations: Step-to pattern;Decreased step length - right;Decreased step length - left;Shuffle;Trunk flexed Gait velocity: decr Gait velocity interpretation: Below normal speed for age/gender General Gait Details: cues for sequence, posture and position from [employer]            Wheelchair Mobility    Modified Rankin (Stroke Patients Only)       Balance                                             Pertinent Vitals/Pain Pain  Assessment: 0-10 Pain Score: 10-Worst pain ever Pain Location: R knee Pain Descriptors / Indicators: Aching;Sore Pain Intervention(s): Limited activity within patient's tolerance;Monitored during session;Premedicated before session;Ice applied    Home Living Family/patient expects to be discharged to:: Private residence Living Arrangements: Alone Available Help at Discharge: Friend(s) Type of Home: House Home Access: Stairs to enter Entrance Stairs-Rails: Psychiatric nurse of Steps: 3 Home Layout: One level Home Equipment: Environmental consultant - 2 wheels      Prior Function Level of Independence: Independent               Hand Dominance        Extremity/Trunk Assessment   Upper Extremity Assessment: Overall WFL for tasks assessed           Lower Extremity Assessment: RLE deficits/detail      Cervical / Trunk Assessment: Normal  Communication   Communication: No difficulties  Cognition Arousal/Alertness: Awake/alert Behavior During Therapy: WFL for tasks assessed/performed Overall Cognitive Status: Within Functional Limits for tasks assessed                      General Comments      Exercises Total Joint Exercises Ankle Circles/Pumps: AROM;Both;15 reps;Supine   Assessment/Plan    PT Assessment Patient needs continued PT services  PT Problem List Decreased strength;Decreased range of motion;Decreased activity tolerance;Decreased mobility;Decreased knowledge of use of DME;Decreased  safety awareness;Pain;Obesity          PT Treatment Interventions DME instruction;Gait training;Stair training;Functional mobility training;Therapeutic activities;Therapeutic exercise;Patient/family education    PT Goals (Current goals can be found in the Care Plan section)  Acute Rehab PT Goals Patient Stated Goal: Regain IND PT Goal Formulation: With patient Time For Goal Achievement: 08/18/16 Potential to Achieve Goals: Good    Frequency 7X/week    Barriers to discharge        Co-evaluation               End of Session Equipment Utilized During Treatment: Gait belt;Right knee immobilizer Activity Tolerance: Patient tolerated treatment well Patient left: in chair;with call bell/phone within reach;with family/visitor present Nurse Communication: Mobility status         Time: [id number] PT Time Calculation (min) (ACUTE ONLY): 23 min   Charges:   PT Evaluation $PT Eval Low Complexity: 1 Procedure PT Treatments $Gait Training: 8-22 mins   PT G Codes:        Toneisha Savary Aug 22, 2016, 5:15 PM

## 2016-08-16 LAB — BASIC METABOLIC PANEL
Anion gap: 5 (ref 5–15)
BUN: 11 mg/dL (ref 6–20)
CHLORIDE: 103 mmol/L (ref 101–111)
CO2: 26 mmol/L (ref 22–32)
CREATININE: 0.85 mg/dL (ref 0.61–1.24)
Calcium: 9.1 mg/dL (ref 8.9–10.3)
GFR calc Af Amer: >60 mL/min (ref >60)
GFR calc non Af Amer: >60 mL/min (ref >60)
GLUCOSE: 150 mg/dL — AB (ref 65–99)
Potassium: 4.2 mmol/L (ref 3.5–5.1)
SODIUM: 134 mmol/L — AB (ref 135–145)

## 2016-08-16 LAB — CBC
HCT: 38.5 % — ABNORMAL LOW (ref 39.0–52.0)
HEMOGLOBIN: 13 g/dL (ref 13.0–17.0)
MCH: 33.2 pg (ref 26.0–34.0)
MCHC: 33.8 g/dL (ref 30.0–36.0)
MCV: 98.5 fL (ref 78.0–100.0)
PLATELETS: 229 10*3/uL (ref 150–400)
RBC: 3.91 MIL/uL — ABNORMAL LOW (ref 4.22–5.81)
RDW: 11.9 % (ref 11.5–15.5)
WBC: 17 10*3/uL — ABNORMAL HIGH (ref 4.0–10.5)

## 2016-08-16 MED ORDER — OXYCODONE-ACETAMINOPHEN 5-325 MG PO TABS: 1.0000 | ORAL_TABLET | ORAL | 0 refills | Status: DC | PRN

## 2016-08-16 MED ORDER — ZOLPIDEM TARTRATE 10 MG PO TABS
10.0000 mg | ORAL_TABLET | Freq: Every evening | ORAL | Status: DC | PRN
  Administered 2016-08-16: 10 mg via ORAL
  Filled 2016-08-16 (×3): qty 1

## 2016-08-16 MED ORDER — METHOCARBAMOL 500 MG PO TABS: 500.0000 mg | ORAL_TABLET | Freq: Four times a day (QID) | ORAL | 1 refills | Status: DC | PRN

## 2016-08-16 MED ORDER — OXYCODONE HCL 5 MG PO TABS
5.0000 mg | ORAL_TABLET | ORAL | Status: DC | PRN
  Administered 2016-08-16 – 2016-08-17 (×7): 10 mg via ORAL
  Filled 2016-08-16 (×6): qty 2
  Filled 2016-08-16: qty 3

## 2016-08-16 NOTE — Op Note (Signed)
[name], [name]              ACCOUNT NO.:  [id number]  MEDICAL RECORD NO.:  [id number]  LOCATION:  [id number]                         FACILITY:  [facility]  PHYSICIAN:  Kipp Brood. Destina Mantei, M.D.DATE OF BIRTH:  07-16-1956  DATE OF PROCEDURE:  08/15/2016 DATE OF DISCHARGE:                              OPERATIVE REPORT   SURGEON:  Kipp Brood. Gladstone Lighter, M.D.  OPERATIVE ASSISTANT:  Ardeen Jourdain, PA.  PREOPERATIVE DIAGNOSES: 1. Flexion contracture, right knee. 2. Primary osteoarthritis with bone on bone, right knee.  POSTOPERATIVE DIAGNOSES: 1. Flexion contracture, right knee. 2. Primary osteoarthritis with bone on bone, right knee.  OPERATION:  Right total knee arthroplasty utilizing the DePuy system, all three components were cemented.  Gentamicin was used in the cement. The sizes used was a size 5 right posterior cruciate sacrificing femoral component.  Tibial tray was a size 4.  The insert was a size 5, 10-mm thickness rotating platform polyethylene type insert.  The patella was a size 41 with 3 pegs.  DESCRIPTION OF PROCEDURE:  Under general anesthesia, routine orthopedic prep and draping of the right lower extremity was carried out. Appropriate time-out was first carried out.  I also marked the appropriate right knee in the holding area.  At this time, the leg was exsanguinated.  Esmarch tourniquet was elevated at 325 mmHg.  The leg was placed in a DeMayo knee holder.  He had 2 g of IV Ancef.  He also had 1 g of tranexamic acid.  At this time, with the knee flexed in anterior approach, the knee was carried out.  Two flaps were created by then going and doing a median parapatellar incision.  I reflected the patella laterally, flexed the knee again, did medial and lateral meniscectomies and excised the anterior, posterior, cruciate ligaments. At this particular time, we irrigated out the knee.  I then removed 11- mm thickness off the distal femur after we inserted our  appropriate device.  Following that, we measured the femur to be a size 5 right.  We then did anterior, posterior and chamfering cuts for a size 5 right femoral component.  Next, attention was directed to tibia.  We removed the exostoses from the tibial plateau.  We then measured the tibia to be a size 5.  At this time, we then made our initial drill hole in the tibial plateau.  Our guide rod was inserted and removed 6 mm thickness off the affected medial side.  We then inserted our laminar spreaders, removed the posterior spurs and completed our debridement, the spurs that was from the distal femur.  Following that, we then inserted our spacer blocks and had a nice fit with the 10-mm thickness spacer block. At that time, we then continued to prepare the tibia by making our keel cut out of the tibial plateau.  We then did our notch cut out of the distal femur.  Some drill holes were made in the medial tibial plateau because the bone was extremely hard.  Following that, we irrigated out the knee, cemented all and then inserted our trial components.  With the trial components in place, we did a resurfacing procedure on the patella with the knee in  extension.  We measured the patella to be a size 41. Three drill holes were made in the patella articular surface.  We then removed all trial components, thoroughly water picked out the knee and cemented all three components in simultaneously with gentamicin in the cement.  At this particular time, we then removed all loose pieces of cement.  Once the cement was hardened, we water picked out the knee, to make sure there were no other loose pieces of cement present.  We then inserted our permanent tibial tray, size 5, 10-mm thickness, reduced the knee and had excellent function and excellent stability.  We then closed the knee in layers over a Hemovac drain.  During the case, we did irrigate with antibiotic solution.  At this particular  time, sterile dressings were applied.          ______________________________ Kipp Brood Gladstone Lighter, M.D.     RAG/MEDQ  D:  08/15/2016  T:  08/16/2016  Job:  [id number]

## 2016-08-16 NOTE — Progress Notes (Signed)
Physical Therapy Treatment Patient Details Name: [name] MRN: [id number] DOB: 11/08/1955 Today's Date: 08/16/2016    History of Present Illness Pt s/p R TKR and with hx of L hip (08)    PT Comments    Pt was seen in bed upon arrival. Required min guard for bed mobility and transfers with minA to support his RLE. Ambulated 150 ft x min guard with pain staying at a 6/10. Performed AROM exercises with pain increasing to an 8/10 during knee flexion. Pt is very impulsive. Will perform stairs this afternoon.   Follow Up Recommendations  Home health PT     Equipment Recommendations   (has walker at home)    Recommendations for Other Services       Precautions / Restrictions Precautions Precautions: Knee;Fall Required Braces or Orthoses: Knee Immobilizer - Right Knee Immobilizer - Right: Discontinue once straight leg raise with < 10 degree lag Restrictions Weight Bearing Restrictions: No Other Position/Activity Restrictions: WBAT    Mobility  Bed Mobility Overal bed mobility: Needs Assistance Bed Mobility: Supine to Sit     Supine to sit: Min guard;Min assist     General bed mobility comments: minA needed to support RLE during bed mobility.   Transfers Overall transfer level: Needs assistance Equipment used: Rolling walker (2 wheeled) Transfers: Sit to/from Stand Sit to Stand: Min guard         General transfer comment: cues for UE placement and safe RLE placement  Ambulation/Gait Ambulation/Gait assistance: Min guard Ambulation Distance (Feet): 150 Feet Assistive device: Rolling walker (2 wheeled) Gait Pattern/deviations: Step-through pattern;Decreased step length - left;Decreased step length - right;Decreased stance time - right;Antalgic;Trunk flexed Gait velocity: decreased Gait velocity interpretation: Below normal speed for age/gender General Gait Details: VCs required for safe positioning of RW during gait and to correct posture.    Stairs             Wheelchair Mobility    Modified Rankin (Stroke Patients Only)       Balance                                    Cognition Arousal/Alertness: Awake/alert Behavior During Therapy: WFL for tasks assessed/performed Overall Cognitive Status: Within Functional Limits for tasks assessed                      Exercises Total Joint Exercises Ankle Circles/Pumps: AROM;10 reps;Supine;Both Quad Sets: AROM;Right;10 reps;Supine Towel Squeeze: AROM;Both;10 reps;Supine Heel Slides: AROM;Right;10 reps;Supine    General Comments        Pertinent Vitals/Pain Pain Assessment: 0-10 Pain Score: 6  Pain Location: R knee Pain Descriptors / Indicators: Discomfort Pain Intervention(s): Monitored during session;Premedicated before session;Ice applied;Repositioned    Home Living                      Prior Function            PT Goals (current goals can now be found in the care plan section) Progress towards PT goals: Progressing toward goals    Frequency    7X/week      PT Plan Current plan remains appropriate    Co-evaluation             End of Session Equipment Utilized During Treatment: Gait belt;Right knee immobilizer Activity Tolerance: Patient tolerated treatment well Patient left: in bed;with call bell/phone within reach;with nursing/sitter in  room     Time: 1030-1054 PT Time Calculation (min) (ACUTE ONLY): 24 min  Charges:  $Gait Training: 8-22 mins $Therapeutic Exercise: 8-22 mins                    G Codes:      Hall Busing, SPTA [facility] Acute Rehab [phone]  Present and agree with above  Rica Koyanagi  PTA [facility]  Acute  Rehab Pager      [phone]

## 2016-08-16 NOTE — Care Management Note (Signed)
Case Management Note  Patient Details  Name: [name] MRN: [id number] Date of Birth: [birth year]  Subjective/Objective: 60 y.o. M admitted 08/15/2016 for R TKR. Pt had L THA 2008. Has DME (RW and BSC) at home and needs no additional DME. Chooses [facility] to provide HHPT which PT is recommending in the Post op period. Tim, rep for [employer], notified                   Action/Plan: Anticipate discharge Friday 08/17/2016. CM will sign off for now but will be available should additional discharge needs arise or disposition change.    Expected Discharge Date:  08/17/16               Expected Discharge Plan:  [place]  In-House Referral:  NA  Discharge planning Services  CM Consult  Post Acute Care Choice:  Durable Medical Equipment, Home Health Choice offered to:  Patient  DME Arranged:  N/A (Has BSC and RW) DME Agency:     HH Arranged:  PT (HHPT and will follow with Outpatient OT at [facility]) [place]:  [facility] ([facility])  Status of Service:  Completed, signed off  If discussed at [place] of Stay Meetings, dates discussed:    Additional Comments:  Delrae Sawyers, RN 08/16/2016, 9:33 AM

## 2016-08-16 NOTE — Progress Notes (Signed)
Physical Therapy Treatment Patient Details Name: [name] MRN: [id number] DOB: 1956-08-09 Today's Date: 08/16/2016    History of Present Illness Pt s/p R TKR and with hx of L hip (08)    PT Comments    Pt was seen in bed upon arrival with family present. Performed bed mobility requiring min guard with min assist for safe handling of RLE. During sit to stand required VC's for safe hand and RLE placement to push up from the bed when standing. Ambulated 125 ft x min guard with 25% VC's for foot placement within the walker and postural awareness. Pain increased to a 6/10 during ambulation. Ascending and descended 3 stairs x minA/min guard with VC's for safe sequencing. Performed LE exercises in bed. Patient refused medication prior to treatment and received before exercises in bed. Pt continues to display impulsive behavior.  Pt plans to D/C home tomorrow.   Follow Up Recommendations  Home health PT     Equipment Recommendations   (Pt has walker at home.)    Recommendations for Other Services       Precautions / Restrictions Precautions Precautions: Fall;Knee Required Braces or Orthoses: Knee Immobilizer - Right Knee Immobilizer - Right: Discontinue once straight leg raise with < 10 degree lag Restrictions Weight Bearing Restrictions: No Other Position/Activity Restrictions: WBAT    Mobility  Bed Mobility Overal bed mobility: Needs Assistance Bed Mobility: Supine to Sit     Supine to sit: Min guard     General bed mobility comments: minA needed to support RLE during bed mobility.   Transfers Overall transfer level: Needs assistance Equipment used: Rolling walker (2 wheeled) Transfers: Sit to/from Stand Sit to Stand: Min guard         General transfer comment: VC's for UE placement and safe RLE placement  Ambulation/Gait Ambulation/Gait assistance: Min guard Ambulation Distance (Feet): 125 Feet Assistive device: Rolling walker (2 wheeled) Gait  Pattern/deviations: Step-through pattern;Decreased stance time - right;Antalgic;Trunk flexed;Decreased step length - right;Decreased step length - left Gait velocity: decreased Gait velocity interpretation: Below normal speed for age/gender General Gait Details: VCs required for safe BLE placement within the walker during ambulation to improve safety awareness and posture.   Stairs Stairs: Yes Stairs assistance: Min assist Stair Management: One rail Left Number of Stairs: 3 General stair comments: VC's required for safe sequencing when ascending and descending stairs.   Wheelchair Mobility    Modified Rankin (Stroke Patients Only)       Balance                                    Cognition Arousal/Alertness: Awake/alert Behavior During Therapy: WFL for tasks assessed/performed Overall Cognitive Status: Within Functional Limits for tasks assessed                      Exercises SAQ/AROM/10 reps/ LLE Hip Abduction/AROM/10 reps/ LLE SLR/AROM/10 reps/LLE     General Comments        Pertinent Vitals/Pain Pain Assessment: 0-10 Pain Score: 2  (pain increased to a 6/10 during ambulation and exercises. ) Pain Location: R knee Pain Descriptors / Indicators: Discomfort;Grimacing Pain Intervention(s): Monitored during session;Ice applied;Repositioned (Pt refused pain medication before session; was given after ambulation and before exercises in bed.)    Home Living                      Prior  Function            PT Goals (current goals can now be found in the care plan section) Progress towards PT goals: Progressing toward goals    Frequency    7X/week      PT Plan Current plan remains appropriate    Co-evaluation             End of Session Equipment Utilized During Treatment: Gait belt;Right knee immobilizer Activity Tolerance: Patient tolerated treatment well;Patient limited by pain Patient left: in bed;with call bell/phone  within reach;with family/visitor present     Time: [id number] PT Time Calculation (min) (ACUTE ONLY): 18 min  Charges:  $Gait Training: 8-22 mins                     G CodesHall Busing, SPTA [facility] Acute Rehab [phone]  Present and agree with above  Rica Koyanagi  PTA [facility]  Acute  Rehab Pager      [phone]

## 2016-08-16 NOTE — Progress Notes (Signed)
Subjective: 1 Day Post-Op Procedure(s) (LRB): TOTAL KNEE ARTHROPLASTY (Right) Patient reports pain as 2 on 0-10 scale.  Doing very well. His hemovac came out prior to my arriving this morning. Dressing changed and his wound is dry.  Objective: Vital signs in last 24 hours: Temp:  [97.4 F (36.3 C)-98.6 F (37 C)] 98.6 F (37 C) (10/26 0628) Pulse Rate:  [43-88] 51 (10/26 0628) Resp:  [9-17] 14 (10/26 0628) BP: (119-162)/(66-93) 162/74 (10/26 0628) SpO2:  [93 %-100 %] 94 % (10/26 0628)  Intake/Output from previous day: 10/25 0701 - 10/26 0700 In: 3955 [P.O.:480; I.V.:3400] Out: 2345 [Urine:1875; Drains:460; Blood:10] Intake/Output this shift: No intake/output data recorded.   Recent Labs  08/16/16 0418  HGB 13.0    Recent Labs  08/16/16 0418  WBC 17.0*  RBC 3.91*  HCT 38.5*  PLT 229    Recent Labs  08/16/16 0418  NA 134*  K 4.2  CL 103  CO2 26  BUN 11  CREATININE 0.85  GLUCOSE 150*  CALCIUM 9.1   No results for input(s): LABPT, INR in the last 72 hours.  No cellulitis present Compartment soft  Assessment/Plan: 1 Day Post-Op Procedure(s) (LRB): TOTAL KNEE ARTHROPLASTY (Right) Up with therapy.Will DC tomorrow.  Dennice Tindol A 08/16/2016, 7:24 AM

## 2016-08-16 NOTE — Evaluation (Signed)
Occupational Therapy Evaluation Patient Details Name: [name] MRN: [id number] DOB: 02-10-1956 Today's Date: 08/16/2016    History of Present Illness Pt s/p R TKR and with hx of L hip (08)   Clinical Impression   This 60 year old man was admitted for the above sx. Will follow in acute to further educate on bathroom transfers and to further assess for DME.  Pt wants to be able to use his standard commode; he has a sink next to it.    Follow Up Recommendations  Supervision/Assistance - 24 hour    Equipment Recommendations   (likely none; pt does not want 3:1)    Recommendations for Other Services       Precautions / Restrictions Precautions Precautions: Knee;Fall Required Braces or Orthoses: Knee Immobilizer - Right Knee Immobilizer - Right: Discontinue once straight leg raise with < 10 degree lag Restrictions Other Position/Activity Restrictions: WBAT      Mobility Bed Mobility                  Transfers     Transfers: Sit to/from Stand Sit to Stand: Min guard         General transfer comment: cues for UE placement    Balance                                            ADL Overall ADL's : Needs assistance/impaired             Lower Body Bathing: Minimal assistance;Sit to/from stand       Lower Body Dressing: Maximal assistance;Sit to/from stand   Toilet Transfer: Min guard;Transfer board;RW (back to bed)             General ADL Comments: eval limited:  pt didn't sleep at all and wanted to get back to bed. He can perform UB adls with set up. He will have 24/7 assistance as needed.  Pt does not want 3:1     Vision     Perception     Praxis      Pertinent Vitals/Pain Pain Assessment: Faces Faces Pain Scale: Hurts even more Pain Location: R knee Pain Intervention(s): Limited activity within patient's tolerance;Monitored during session;Premedicated before session;Repositioned     Hand Dominance      Extremity/Trunk Assessment Upper Extremity Assessment Upper Extremity Assessment: Overall WFL for tasks assessed           Communication Communication Communication: No difficulties   Cognition Arousal/Alertness: Awake/alert Behavior During Therapy: WFL for tasks assessed/performed Overall Cognitive Status: Within Functional Limits for tasks assessed                     General Comments       Exercises       Shoulder Instructions      Home Living Family/patient expects to be discharged to:: Private residence Living Arrangements: Alone Available Help at Discharge: Friend(s)               Bathroom Shower/Tub: Walk-in Psychologist, prison and probation services: Standard     Home Equipment: Environmental consultant - 2 wheels   Additional Comments: sink next to commode      Prior Functioning/Environment Level of Independence: Independent                 OT Problem List: Decreased activity tolerance;Decreased knowledge of use  of DME or AE;Pain   OT Treatment/Interventions: Self-care/ADL training;DME and/or AE instruction;Patient/family education    OT Goals(Current goals can be found in the care plan section) Acute Rehab OT Goals Patient Stated Goal: Regain IND OT Goal Formulation: With patient Time For Goal Achievement: 08/23/16 Potential to Achieve Goals: Good ADL Goals Pt Will Transfer to Toilet: with min guard assist;regular height toilet;ambulating Pt Will Perform Tub/Shower Transfer: Shower transfer;with min guard assist;ambulating  OT Frequency: Min 2X/week   Barriers to D/C:            Co-evaluation              End of Session    Activity Tolerance: Patient limited by pain;Patient limited by fatigue Patient left: in bed;with call bell/phone within reach;with nursing/sitter in room   Time: 0800-0814 OT Time Calculation (min): 14 min Charges:  OT General Charges $OT Visit: 1 Procedure OT Evaluation $OT Eval Low Complexity: 1 Procedure G-Codes:     Jahniyah Revere August 17, 2016, 8:23 AM Lesle Chris, OTR/L [phone] 08/17/2016

## 2016-08-17 LAB — BASIC METABOLIC PANEL
Anion gap: 5 (ref 5–15)
BUN: 11 mg/dL (ref 6–20)
CO2: 28 mmol/L (ref 22–32)
CREATININE: 0.92 mg/dL (ref 0.61–1.24)
Calcium: 8.9 mg/dL (ref 8.9–10.3)
Chloride: 105 mmol/L (ref 101–111)
GFR calc Af Amer: >60 mL/min (ref >60)
GLUCOSE: 110 mg/dL — AB (ref 65–99)
POTASSIUM: 4 mmol/L (ref 3.5–5.1)
SODIUM: 138 mmol/L (ref 135–145)

## 2016-08-17 LAB — CBC
HCT: 36.3 % — ABNORMAL LOW (ref 39.0–52.0)
Hemoglobin: 12.2 g/dL — ABNORMAL LOW (ref 13.0–17.0)
MCH: 33.7 pg (ref 26.0–34.0)
MCHC: 33.6 g/dL (ref 30.0–36.0)
MCV: 100.3 fL — AB (ref 78.0–100.0)
PLATELETS: 203 10*3/uL (ref 150–400)
RBC: 3.62 MIL/uL — AB (ref 4.22–5.81)
RDW: 12.2 % (ref 11.5–15.5)
WBC: 11.7 10*3/uL — ABNORMAL HIGH (ref 4.0–10.5)

## 2016-08-17 MED ORDER — METHOCARBAMOL 500 MG PO TABS: 500.0000 mg | ORAL_TABLET | Freq: Four times a day (QID) | ORAL | 1 refills | Status: DC | PRN

## 2016-08-17 MED ORDER — ASPIRIN EC 325 MG PO TBEC: 325.0000 mg | DELAYED_RELEASE_TABLET | Freq: Two times a day (BID) | ORAL | 0 refills | Status: DC

## 2016-08-17 MED ORDER — OXYCODONE HCL 5 MG PO TABS: 5.0000 mg | ORAL_TABLET | ORAL | 0 refills | Status: DC | PRN

## 2016-08-17 NOTE — Progress Notes (Signed)
Occupational Therapy Treatment Patient Details Name: DYON WAYCHOFF MRN: [id number] DOB: 1956-04-26 Today's Date: 08/17/2016    History of present illness Pt s/p R TKR and with hx of L hip (08)   OT comments  All education completed this session.  No further OT needs  Follow Up Recommendations  Supervision/Assistance - 24 hour    Equipment Recommendations  None recommended by OT    Recommendations for Other Services      Precautions / Restrictions Precautions Precautions: Fall;Knee Precaution Comments: pt reports he is no longer using KI Restrictions Weight Bearing Restrictions: No Other Position/Activity Restrictions: WBAT       Mobility Bed Mobility                  Transfers   Equipment used: Rolling walker (2 wheeled)   Sit to Stand: Supervision              Balance                                   ADL                           Toilet Transfer: Min guard;Ambulation;RW;Regular Toilet       Tub/ Shower Transfer: Walk-in shower;Min guard;Ambulation     General ADL Comments: pt has a standard commode with sink and grab bar; should be OK with this based on transfer to commode with bar and RW.  Practiced shower transfer, with cues for sequence. He has a seat in shower and grab bar.  He has no concerns about home and has made his house accessible      Vision                     Perception     Praxis      Cognition   Behavior During Therapy: [facility] for tasks assessed/performed Overall Cognitive Status: Within Functional Limits for tasks assessed                       Extremity/Trunk Assessment               Exercises     Shoulder Instructions       General Comments      Pertinent Vitals/ Pain       Pain Score: 10-Worst pain ever Pain Location: R knee Pain Intervention(s): Limited activity within patient's tolerance;Monitored during session;Premedicated before  session;Repositioned;Ice applied  Home Living                                          Prior Functioning/Environment              Frequency           Progress Toward Goals  OT Goals(current goals can now be found in the care plan section)  Progress towards OT goals: Progressing toward goals     Plan      Co-evaluation                 End of Session     Activity Tolerance Patient tolerated treatment well   Patient Left in chair;with call bell/phone within reach;with family/visitor present   Nurse Communication  Time: [id number] OT Time Calculation (min): 9 min  Charges: OT General Charges $OT Visit: 1 Procedure OT Treatments $Self Care/Home Management : 8-22 mins  Zekiah Caruth 08/17/2016, 11:19 AM Lesle Chris, OTR/L [phone] 08/17/2016

## 2016-08-17 NOTE — Progress Notes (Signed)
   Subjective: 2 Days Post-Op Procedure(s) (LRB): TOTAL KNEE ARTHROPLASTY (Right) Patient reports pain as mild.   Patient seen in rounds for Dr. Gladstone Lighter. Patient is well, but has had some minor complaints of pain in the right knee, requiring pain medications. He is also having difficulty sleeping. Voiding well. Positive flatus. No SOB or chest pain. No issue overnight.   Objective: Vital signs in last 24 hours: Temp:  [97.6 F (36.4 C)-98.7 F (37.1 C)] 97.6 F (36.4 C) (10/27 0513) Pulse Rate:  [58-74] 58 (10/27 0513) Resp:  [15-16] 16 (10/27 0513) BP: (126-163)/(55-74) 126/55 (10/27 0513) SpO2:  [95 %-99 %] 99 % (10/27 0513)  Intake/Output from previous day:  Intake/Output Summary (Last 24 hours) at 08/17/16 0732 Last data filed at 08/17/16 0600  Gross per 24 hour  Intake             2130 ml  Output              575 ml  Net             1555 ml     Labs:  Recent Labs  08/16/16 0418 08/17/16 0422  HGB 13.0 12.2*    Recent Labs  08/16/16 0418 08/17/16 0422  WBC 17.0* 11.7*  RBC 3.91* 3.62*  HCT 38.5* 36.3*  PLT 229 203    Recent Labs  08/16/16 0418 08/17/16 0422  NA 134* 138  K 4.2 4.0  CL 103 105  CO2 26 28  BUN 11 11  CREATININE 0.85 0.92  GLUCOSE 150* 110*  CALCIUM 9.1 8.9   EXAM General - Patient is Alert and Oriented Extremity - Neurologically intact Intact pulses distally Dorsiflexion/Plantar flexion intact No cellulitis present Compartment soft Dressing/Incision - clean, dry, no drainage from the incision. Slight bloody drainage from the hemovac site.  Motor Function - intact, moving foot and toes well on exam.   Past Medical History:  Diagnosis Date  . Arthritis   . Bone infection [facility])    age 26 - ? staph   . History of kidney stones    as a teenager     Assessment/Plan: 2 Days Post-Op Procedure(s) (LRB): TOTAL KNEE ARTHROPLASTY (Right) Active Problems:   S/P TKR (total knee replacement) using cement, right  Estimated body  mass index is 32.16 kg/m as calculated from the following:   Height as of this encounter: 5\' 9"  (1.753 m).   Weight as of this encounter: 98.8 kg (217 lb 12 oz). Advance diet Up with therapy D/C IV fluids Discharge home with home health  DVT Prophylaxis - Aspirin Weight-Bearing as tolerated   Patient progressed well with therapy yesterday. Will continue with therapy this morning. Plan for DC home today with HHPT. Discharge instructions discussed. Follow up in office in 2 weeks.   Ardeen Jourdain, PA-C Orthopaedic Surgery 08/17/2016, 7:32 AM

## 2016-08-17 NOTE — Progress Notes (Signed)
Physical Therapy Treatment Patient Details Name: [name] MRN: [id number] DOB: 26-Jan-1956 Today's Date: 08/17/2016    History of Present Illness Pt s/p R TKR and with hx of L hip (08)    PT Comments    Pt was seen in bed upon arrival with family member. Was agitated upon arrival because "we woke him up," but is ready to return home. Performed bed mobility requiring min guard and minA to safely position RLE. Transfers x S with VC's for safe UE placement to push up from the bed and to reach back before sitting. Gait x 100 ft with VC's for foot placement within the walker and to maintain an erect posture. Stairs x 3 with VC's for safe sequencing when ascending and descending stairs. Family educated on ascending and descending stairs and on proper positioning when assisting patient get in/out of car and into the house. Will be D/C home today.   Follow Up Recommendations  Home health PT     Equipment Recommendations   (Pt has walker and crutch at home)    Recommendations for Other Services       Precautions / Restrictions Precautions Precautions: Fall;Knee Precaution Comments: pt reports he is no longer using KI Required Braces or Orthoses: Knee Immobilizer - Right Knee Immobilizer - Right: Discontinue once straight leg raise with < 10 degree lag Restrictions Weight Bearing Restrictions: No Other Position/Activity Restrictions: WBAT    Mobility  Bed Mobility Overal bed mobility: Needs Assistance Bed Mobility: Supine to Sit     Supine to sit: Min guard     General bed mobility comments: minA needed to support RLE during bed mobility.   Transfers Overall transfer level: Needs assistance Equipment used: Rolling walker (2 wheeled) Transfers: Sit to/from Stand Sit to Stand: Supervision         General transfer comment: VC's for UE placement and safe RLE placement  Ambulation/Gait Ambulation/Gait assistance: Min guard Ambulation Distance (Feet): 100  Feet Assistive device: Rolling walker (2 wheeled) Gait Pattern/deviations: Step-through pattern;Decreased stance time - right;Antalgic;Decreased step length - right;Decreased step length - left;Trunk flexed Gait velocity: decreased Gait velocity interpretation: Below normal speed for age/gender General Gait Details: VCs required for safe BLE placement within the walker during ambulation to improve safety awareness and to maintain upright posture.   Stairs Stairs: Yes Stairs assistance: Min assist Stair Management: One rail Left, one crutch Right Number of Stairs: 3 General stair comments: VC's required and family education for safe sequencing when ascending and descending stairs.    Wheelchair Mobility    Modified Rankin (Stroke Patients Only)       Balance                                    Cognition Arousal/Alertness: Awake/alert Behavior During Therapy: Agitated;WFL for tasks assessed/performed Overall Cognitive Status: Within Functional Limits for tasks assessed                      Exercises Total Joint Exercises Ankle Circles/Pumps: AROM;10 reps;Supine;Both Quad Sets: AROM;Right;10 reps;Supine Towel Squeeze: AROM;Both;10 reps;Supine Heel Slides: AROM;Right;10 reps;Supine Hip ABduction/ADduction: AROM;Right;Supine;10 reps Straight Leg Raises: AROM;Right;10 reps;Supine    General Comments        Pertinent Vitals/Pain Pain Assessment: 0-10 Pain Score: 4  Pain Location: R knee Pain Descriptors / Indicators: Discomfort;Grimacing Pain Intervention(s): Monitored during session;Premedicated before session;Ice applied;Repositioned    Home Living  Prior Function            PT Goals (current goals can now be found in the care plan section) Progress towards PT goals: Progressing toward goals    Frequency    7X/week      PT Plan Current plan remains appropriate    Co-evaluation             End  of Session Equipment Utilized During Treatment: Gait belt;Right knee immobilizer Activity Tolerance: Patient tolerated treatment well;Patient limited by pain Patient left: with call bell/phone within reach;with family/visitor present;in chair     Time: 1000-1033 PT Time Calculation (min) (ACUTE ONLY): 33 min  Charges:  $Gait Training: 8-22 mins $Therapeutic Exercise: 8-22 mins                    G Codes:      Hall Busing, SPTA [facility] Acute Rehab [phone]  Present and agree with above  Rica Koyanagi  PTA [facility]  Acute  Rehab Pager      [phone]

## 2016-08-17 NOTE — Progress Notes (Signed)
Pt to d/c home with [facility] for PT. No DME needs. AVS reviewed and "My Chart" discussed with pt. Pt capable of verbalizing medications, dressing changes, signs and symptoms of infection, and follow-up appointments. Remains hemodynamically stable. No signs and symptoms of distress. Educated pt to return to ER in the case of SOB, dizziness, or chest pain.

## 2016-08-22 NOTE — Discharge Summary (Signed)
Physician Discharge Summary   Patient ID: [name] MRN: [id number] DOB/AGE: 1956-08-27 60 y.o.  Admit date: 08/15/2016 Discharge date: 08/17/2016  Primary Diagnosis: Primary osteoarthritis right knee  Admission Diagnoses:  Past Medical History:  Diagnosis Date  . Arthritis   . Bone infection [facility])    age 29 - ? staph   . History of kidney stones    as a teenager    Discharge Diagnoses:   Active Problems:   S/P TKR (total knee replacement) using cement, right  Estimated body mass index is 32.16 kg/m as calculated from the following:   Height as of this encounter: _0  (1.753 m).   Weight as of this encounter: 98.8 kg (217 lb 12 oz).  Procedure:  Procedure(s) (LRB): TOTAL KNEE ARTHROPLASTY (Right)   Consults: None  HPI: [name], 60 y.o. male, has a history of pain and functional disability in the right knee due to arthritis and has failed non-surgical conservative treatments for greater than 12 weeks to includeNSAID's and/or analgesics, corticosteriod injections, flexibility and strengthening excercises and activity modification.  Onset of symptoms was gradual, starting 2 years ago with gradually worsening course since that time. The patient noted no past surgery on the right knee(s).  Patient currently rates pain in the right knee(s) at 8 out of 10 with activity. Patient has night pain, worsening of pain with activity and weight bearing, pain that interferes with activities of daily living, pain with passive range of motion, crepitus and joint swelling.  Patient has evidence of periarticular osteophytes and joint space narrowing by imaging studies. There is no active infection.  Laboratory Data: Admission on 08/15/2016, Discharged on 08/17/2016  Component Date Value Ref Range Status  . WBC 08/16/2016 17.0* 4.0 - 10.5 K/uL Final  . RBC 08/16/2016 3.91* 4.22 - 5.81 MIL/uL Final  . Hemoglobin 08/16/2016 13.0  13.0 - 17.0 g/dL Final  . HCT 08/16/2016 38.5* 39.0  - 52.0 % Final  . MCV 08/16/2016 98.5  78.0 - 100.0 fL Final  . MCH 08/16/2016 33.2  26.0 - 34.0 pg Final  . MCHC 08/16/2016 33.8  30.0 - 36.0 g/dL Final  . RDW 08/16/2016 11.9  11.5 - 15.5 % Final  . Platelets 08/16/2016 229  150 - 400 K/uL Final  . Sodium 08/16/2016 134* 135 - 145 mmol/L Final  . Potassium 08/16/2016 4.2  3.5 - 5.1 mmol/L Final  . Chloride 08/16/2016 103  101 - 111 mmol/L Final  . CO2 08/16/2016 26  22 - 32 mmol/L Final  . Glucose, Bld 08/16/2016 150* 65 - 99 mg/dL Final  . BUN 08/16/2016 11  6 - 20 mg/dL Final  . Creatinine, Ser 08/16/2016 0.85  0.61 - 1.24 mg/dL Final  . Calcium 08/16/2016 9.1  8.9 - 10.3 mg/dL Final  . GFR calc non Af Amer 08/16/2016 >60  >60 mL/min Final  . GFR calc Af Amer 08/16/2016 >60  >60 mL/min Final   Comment: (NOTE) The eGFR has been calculated using the CKD EPI equation. This calculation has not been validated in all clinical situations. eGFR's persistently <60 mL/min signify possible Chronic Kidney Disease.   . Anion gap 08/16/2016 5  5 - 15 Final  . WBC 08/17/2016 11.7* 4.0 - 10.5 K/uL Final  . RBC 08/17/2016 3.62* 4.22 - 5.81 MIL/uL Final  . Hemoglobin 08/17/2016 12.2* 13.0 - 17.0 g/dL Final  . HCT 08/17/2016 36.3* 39.0 - 52.0 % Final  . MCV 08/17/2016 100.3* 78.0 - 100.0 fL Final  .  [place] 08/17/2016 33.7  26.0 - 34.0 pg Final  . MCHC 08/17/2016 33.6  30.0 - 36.0 g/dL Final  . RDW 08/17/2016 12.2  11.5 - 15.5 % Final  . Platelets 08/17/2016 203  150 - 400 K/uL Final  . Sodium 08/17/2016 138  135 - 145 mmol/L Final  . Potassium 08/17/2016 4.0  3.5 - 5.1 mmol/L Final  . Chloride 08/17/2016 105  101 - 111 mmol/L Final  . CO2 08/17/2016 28  22 - 32 mmol/L Final  . Glucose, Bld 08/17/2016 110* 65 - 99 mg/dL Final  . BUN 08/17/2016 11  6 - 20 mg/dL Final  . Creatinine, Ser 08/17/2016 0.92  0.61 - 1.24 mg/dL Final  . Calcium 08/17/2016 8.9  8.9 - 10.3 mg/dL Final  . GFR calc non Af Amer 08/17/2016 >60  >60 mL/min Final  . GFR calc Af  Amer 08/17/2016 >60  >60 mL/min Final   Comment: (NOTE) The eGFR has been calculated using the CKD EPI equation. This calculation has not been validated in all clinical situations. eGFR's persistently <60 mL/min signify possible Chronic Kidney Disease.   Georgiann Hahn gap 08/17/2016 5  5 - 15 Final  Hospital Outpatient Visit on 08/10/2016  Component Date Value Ref Range Status  . aPTT 08/10/2016 30  24 - 36 seconds Final  . WBC 08/10/2016 9.1  4.0 - 10.5 K/uL Final  . RBC 08/10/2016 4.54  4.22 - 5.81 MIL/uL Final  . Hemoglobin 08/10/2016 15.4  13.0 - 17.0 g/dL Final  . HCT 08/10/2016 43.5  39.0 - 52.0 % Final  . MCV 08/10/2016 95.8  78.0 - 100.0 fL Final  . MCH 08/10/2016 33.9  26.0 - 34.0 pg Final  . MCHC 08/10/2016 35.4  30.0 - 36.0 g/dL Final  . RDW 08/10/2016 12.0  11.5 - 15.5 % Final  . Platelets 08/10/2016 212  150 - 400 K/uL Final  . Neutrophils Relative % 08/10/2016 53  % Final  . Neutro Abs 08/10/2016 4.9  1.7 - 7.7 K/uL Final  . Lymphocytes Relative 08/10/2016 36  % Final  . Lymphs Abs 08/10/2016 3.2  0.7 - 4.0 K/uL Final  . Monocytes Relative 08/10/2016 9  % Final  . Monocytes Absolute 08/10/2016 0.8  0.1 - 1.0 K/uL Final  . Eosinophils Relative 08/10/2016 2  % Final  . Eosinophils Absolute 08/10/2016 0.2  0.0 - 0.7 K/uL Final  . Basophils Relative 08/10/2016 0  % Final  . Basophils Absolute 08/10/2016 0.0  0.0 - 0.1 K/uL Final  . Sodium 08/10/2016 137  135 - 145 mmol/L Final  . Potassium 08/10/2016 3.8  3.5 - 5.1 mmol/L Final  . Chloride 08/10/2016 107  101 - 111 mmol/L Final  . CO2 08/10/2016 24  22 - 32 mmol/L Final  . Glucose, Bld 08/10/2016 98  65 - 99 mg/dL Final  . BUN 08/10/2016 10  6 - 20 mg/dL Final  . Creatinine, Ser 08/10/2016 0.90  0.61 - 1.24 mg/dL Final  . Calcium 08/10/2016 9.3  8.9 - 10.3 mg/dL Final  . Total Protein 08/10/2016 6.9  6.5 - 8.1 g/dL Final  . Albumin 08/10/2016 4.1  3.5 - 5.0 g/dL Final  . AST 08/10/2016 19  15 - 41 U/L Final  . ALT  08/10/2016 22  17 - 63 U/L Final  . Alkaline Phosphatase 08/10/2016 81  38 - 126 U/L Final  . Total Bilirubin 08/10/2016 0.7  0.3 - 1.2 mg/dL Final  . GFR calc non Af Amer 08/10/2016 >60  >  60 mL/min Final  . GFR calc Af Amer 08/10/2016 >60  >60 mL/min Final   Comment: (NOTE) The eGFR has been calculated using the CKD EPI equation. This calculation has not been validated in all clinical situations. eGFR's persistently <60 mL/min signify possible Chronic Kidney Disease.   . Anion gap 08/10/2016 6  5 - 15 Final  . Prothrombin Time 08/10/2016 12.8  11.4 - 15.2 seconds Final  . INR 08/10/2016 0.96   Final  . ABO/RH(D) 08/15/2016 O POS   Final  . Antibody Screen 08/15/2016 NEG   Final  . Sample Expiration 08/15/2016 08/18/2016   Final  . Extend sample reason 08/15/2016 NO TRANSFUSIONS OR PREGNANCY IN THE PAST 3 MONTHS   Final  . Color, Urine 08/10/2016 YELLOW  YELLOW Final  . APPearance 08/10/2016 CLEAR  CLEAR Final  . Specific Gravity, Urine 08/10/2016 1.017  1.005 - 1.030 Final  . pH 08/10/2016 7.0  5.0 - 8.0 Final  . Glucose, UA 08/10/2016 NEGATIVE  NEGATIVE mg/dL Final  . Hgb urine dipstick 08/10/2016 TRACE* NEGATIVE Final  . Bilirubin Urine 08/10/2016 NEGATIVE  NEGATIVE Final  . Ketones, ur 08/10/2016 NEGATIVE  NEGATIVE mg/dL Final  . Protein, ur 08/10/2016 NEGATIVE  NEGATIVE mg/dL Final  . Nitrite 08/10/2016 NEGATIVE  NEGATIVE Final  . Leukocytes, UA 08/10/2016 NEGATIVE  NEGATIVE Final  . MRSA, PCR 08/10/2016 NEGATIVE  NEGATIVE Final  . Staphylococcus aureus 08/10/2016 NEGATIVE  NEGATIVE Final   Comment:        The Xpert SA Assay (FDA approved for NASAL specimens in patients over 32 years of age), is one component of a comprehensive surveillance program.  Test performance has been validated by [facility] for patients greater than or equal to 35 year old. It is not intended to diagnose infection nor to guide or monitor treatment.   . Squamous Epithelial / LPF 08/10/2016  NONE SEEN  NONE SEEN Final  . WBC, UA 08/10/2016 0-5  0 - 5 WBC/hpf Final  . RBC / HPF 08/10/2016 0-5  0 - 5 RBC/hpf Final  . Bacteria, UA 08/10/2016 NONE SEEN  NONE SEEN Final     Hospital Course: [name] is a 60 y.o. who was admitted to [facility]. They were brought to the operating room on 08/15/2016 and underwent Procedure(s): TOTAL KNEE ARTHROPLASTY.  Patient tolerated the procedure well and was later transferred to the recovery room and then to the orthopaedic floor for postoperative care.  They were given PO and IV analgesics for pain control following their surgery.  They were given 24 hours of postoperative antibiotics of  Anti-infectives    Start     Dose/Rate Route Frequency Ordered Stop   08/15/16 1400  ceFAZolin (ANCEF) IVPB 1 g/50 mL premix     1 g 100 mL/hr over 30 Minutes Intravenous Every 6 hours 08/15/16 1241 08/15/16 2037   08/15/16 0847  polymyxin B 500,000 Units, bacitracin 50,000 Units in sodium chloride irrigation 0.9 % 500 mL irrigation  Status:  Discontinued       As needed 08/15/16 0847 08/15/16 0932   08/15/16 0559  ceFAZolin (ANCEF) IVPB 2g/100 mL premix     2 g 200 mL/hr over 30 Minutes Intravenous On call to O.R. 08/15/16 0559 08/15/16 0734     and started on DVT prophylaxis in the form of Xarelto.   PT and OT were ordered for total joint protocol.  Discharge planning consulted to help with postop disposition and equipment needs.  Patient had  a fair night on the evening of surgery.  They started to get up OOB with therapy on day one. Hemovac drain was pulled without difficulty. Dressing was changed and the incision was clean with mild frank blood drainage from the hemovac site. Patient had some issues with increased pain after therapy but responded well to change in pain medicine.  Continued to work with therapy into day two.  The patient had progressed with therapy and meeting their goals.  Incision was healing well.  Patient was seen in rounds and  was ready to go home.   Diet: Cardiac diet Activity:WBAT Follow-up:in 2 weeks Disposition - Home Discharged Condition: stable   Discharge Instructions    Call MD / Call 911    Complete by:  As directed    If you experience chest pain or shortness of breath, CALL 911 and be transported to the hospital emergency room.  If you develope a fever above 101 F, pus (white drainage) or increased drainage or redness at the wound, or calf pain, call your surgeon's office.   Constipation Prevention    Complete by:  As directed    Drink plenty of fluids.  Prune juice may be helpful.  You may use a stool softener, such as Colace (over the counter) 100 mg twice a day.  Use MiraLax (over the counter) for constipation as needed.   Diet - low sodium heart healthy    Complete by:  As directed    Discharge instructions    Complete by:  As directed    INSTRUCTIONS AFTER JOINT REPLACEMENT   Remove items at home which could result in a fall. This includes throw rugs or furniture in walking pathways ICE to the affected joint every three hours while awake for 30 minutes at a time, for at least the first 3-5 days, and then as needed for pain and swelling.  Continue to use ice for pain and swelling. You may notice swelling that will progress down to the foot and ankle.  This is normal after surgery.  Elevate your leg when you are not up walking on it.   Continue to use the breathing machine you got in the hospital (incentive spirometer) which will help keep your temperature down.  It is common for your temperature to cycle up and down following surgery, especially at night when you are not up moving around and exerting yourself.  The breathing machine keeps your lungs expanded and your temperature down.   DIET:  As you were doing prior to hospitalization, we recommend a well-balanced diet.  DRESSING / WOUND CARE / SHOWERING  You may change your dressing every day with sterile gauze.  Please use good hand  washing techniques before changing the dressing.  Do not use any lotions or creams on the incision until instructed by your surgeon.  ACTIVITY  Increase activity slowly as tolerated, but follow the weight bearing instructions below.   No driving for 6 weeks or until further direction given by your physician.  You cannot drive while taking narcotics.  No lifting or carrying greater than 10 lbs. until further directed by your surgeon. Avoid periods of inactivity such as sitting longer than an hour when not asleep. This helps prevent blood clots.  You may return to work once you are authorized by your doctor.     WEIGHT BEARING   Weight bearing as tolerated with assist device (walker, cane, etc) as directed, use it as long as suggested by your surgeon or  therapist, typically at least 4-6 weeks.   EXERCISES  Results after joint replacement surgery are often greatly improved when you follow the exercise, range of motion and muscle strengthening exercises prescribed by your doctor. Safety measures are also important to protect the joint from further injury. Any time any of these exercises cause you to have increased pain or swelling, decrease what you are doing until you are comfortable again and then slowly increase them. If you have problems or questions, call your caregiver or physical therapist for advice.   Rehabilitation is important following a joint replacement. After just a few days of immobilization, the muscles of the leg can become weakened and shrink (atrophy).  These exercises are designed to build up the tone and strength of the thigh and leg muscles and to improve motion. Often times heat used for twenty to thirty minutes before working out will loosen up your tissues and help with improving the range of motion but do not use heat for the first two weeks following surgery (sometimes heat can increase post-operative swelling).   These exercises can be done on a training (exercise)  mat, on the floor, on a table or on a bed. Use whatever works the best and is most comfortable for you.    Use music or television while you are exercising so that the exercises are a pleasant break in your day. This will make your life better with the exercises acting as a break in your routine that you can look forward to.   Perform all exercises about fifteen times, three times per day or as directed.  You should exercise both the operative leg and the other leg as well.  Exercises include:   Quad Sets - Tighten up the muscle on the front of the thigh (Quad) and hold for 5-10 seconds.   Straight Leg Raises - With your knee straight (if you were given a brace, keep it on), lift the leg to 60 degrees, hold for 3 seconds, and slowly lower the leg.  Perform this exercise against resistance later as your leg gets stronger.  Leg Slides: Lying on your back, slowly slide your foot toward your buttocks, bending your knee up off the floor (only go as far as is comfortable). Then slowly slide your foot back down until your leg is flat on the floor again.  Angel Wings: Lying on your back spread your legs to the side as far apart as you can without causing discomfort.  Hamstring Strength:  Lying on your back, push your heel against the floor with your leg straight by tightening up the muscles of your buttocks.  Repeat, but this time bend your knee to a comfortable angle, and push your heel against the floor.  You may put a pillow under the heel to make it more comfortable if necessary.   A rehabilitation program following joint replacement surgery can speed recovery and prevent re-injury in the future due to weakened muscles. Contact your doctor or a physical therapist for more information on knee rehabilitation.    CONSTIPATION  Constipation is defined medically as fewer than three stools per week and severe constipation as less than one stool per week.  Even if you have a regular bowel pattern at home, your  normal regimen is likely to be disrupted due to multiple reasons following surgery.  Combination of anesthesia, postoperative narcotics, change in appetite and fluid intake all can affect your bowels.   YOU MUST use at least one of the  following options; they are listed in order of increasing strength to get the job done.  They are all available over the counter, and you may need to use some, POSSIBLY even all of these options:    Drink plenty of fluids (prune juice may be helpful) and high fiber foods Colace 100 mg by mouth twice a day  Senokot for constipation as directed and as needed Dulcolax (bisacodyl), take with full glass of water  Miralax (polyethylene glycol) once or twice a day as needed.  If you have tried all these things and are unable to have a bowel movement in the first 3-4 days after surgery call either your surgeon or your primary doctor.    If you experience loose stools or diarrhea, hold the medications until you stool forms back up.  If your symptoms do not get better within 1 week or if they get worse, check with your doctor.  If you experience "the worst abdominal pain ever" or develop nausea or vomiting, please contact the office immediately for further recommendations for treatment.   ITCHING:  If you experience itching with your medications, try taking only a single pain pill, or even half a pain pill at a time.  You can also use Benadryl over the counter for itching or also to help with sleep.   TED HOSE STOCKINGS:  Use stockings on both legs until for at least 2 weeks or as directed by physician office. They may be removed at night for sleeping.  MEDICATIONS:  See your medication summary on the "After Visit Summary" that nursing will review with you.  You may have some home medications which will be placed on hold until you complete the course of blood thinner medication.  It is important for you to complete the blood thinner medication as prescribed.  PRECAUTIONS:   If you experience chest pain or shortness of breath - call 911 immediately for transfer to the hospital emergency department.   If you develop a fever greater that 101 F, purulent drainage from wound, increased redness or drainage from wound, foul odor from the wound/dressing, or calf pain - CONTACT YOUR SURGEON.                                                   FOLLOW-UP APPOINTMENTS:  If you do not already have a post-op appointment, please call the office for an appointment to be seen by your surgeon.  Guidelines for how soon to be seen are listed in your "After Visit Summary", but are typically between 1-4 weeks after surgery.   MAKE SURE YOU:  Understand these instructions.  Get help right away if you are not doing well or get worse.    Thank you for letting us be a part of your medical care team.  It is a privilege we respect greatly.  We hope these instructions will help you stay on track for a fast and full recovery!   Increase activity slowly as tolerated    Complete by:  As directed        Medication List    STOP taking these medications   ibuprofen 200 MG tablet Commonly known as:  ADVIL,MOTRIN     TAKE these medications   aspirin EC 325 MG tablet Take 1 tablet (325 mg total) by mouth 2 (two) times daily. To prevent  blood clots   methocarbamol 500 MG tablet Commonly known as:  ROBAXIN Take 1 tablet (500 mg total) by mouth every 6 (six) hours as needed for muscle spasms.   oxyCODONE 5 MG immediate release tablet Commonly known as:  Oxy IR/ROXICODONE Take 1-3 tablets (5-15 mg total) by mouth every 3 (three) hours as needed for severe pain or breakthrough pain.      Follow-up Information    GIOFFRE,RONALD A, MD. Schedule an appointment as soon as possible for a visit in 2 week(s).   Specialty:  Orthopedic Surgery Contact information: [address] [place] [address] [phone]        [facility] .   Why:  HHPT has been ordered  by your MD. A representative from the agency should be in touch with you within 24 hours of discharge to arrange your initial visit.  Contact information: [address] [place] [address] [phone]           Signed: Ardeen Jourdain, PA-C Orthopaedic Surgery 08/22/2016, 10:56 AM

## 2017-11-27 ENCOUNTER — Encounter: Payer: Self-pay | Admitting: Gastroenterology

## 2018-10-13 ENCOUNTER — Ambulatory Visit: Payer: Self-pay | Admitting: Orthopedic Surgery

## 2018-11-13 ENCOUNTER — Ambulatory Visit: Payer: Self-pay | Admitting: Orthopedic Surgery

## 2018-11-13 NOTE — H&P (Signed)
TOTAL HIP ADMISSION H&P  Patient is admitted for right total hip arthroplasty.  Subjective:  Chief Complaint: right hip pain  HPI: [name], 63 y.o. male, has a history of pain and functional disability in the right hip(s) due to trauma and arthritis and patient has failed non-surgical conservative treatments for greater than 12 weeks to include NSAID's and/or analgesics, flexibility and strengthening excercises, use of assistive devices, weight reduction as appropriate and activity modification.  Onset of symptoms was gradual starting 5 years ago with rapidlly worsening course since that time.The patient noted no past surgery on the right hip(s).  Patient currently rates pain in the right hip at 10 out of 10 with activity. Patient has night pain, worsening of pain with activity and weight bearing, trendelenberg gait, pain that interfers with activities of daily living, pain with passive range of motion and crepitus. Patient has evidence of subchondral cysts, subchondral sclerosis, periarticular osteophytes and joint space narrowing by imaging studies. This condition presents safety issues increasing the risk of falls. There is no current active infection.  Patient Active Problem List   Diagnosis Date Noted  . S/P TKR (total knee replacement) using cement, right 08/15/2016   Past Medical History:  Diagnosis Date  . Arthritis   . Bone infection [facility])    age 25 - ? staph   . History of kidney stones    as a teenager     Past Surgical History:  Procedure Laterality Date  . JOINT REPLACEMENT     left hip - 20008   . TOTAL KNEE ARTHROPLASTY Right 08/15/2016   Procedure: TOTAL KNEE ARTHROPLASTY;  Surgeon: Latanya Maudlin, MD;  Location: [facility];  Service: Orthopedics;  Laterality: Right;    Current Outpatient Medications  Medication Sig Dispense Refill Last Dose  . aspirin EC 325 MG tablet Take 1 tablet (325 mg total) by mouth 2 (two) times daily. To prevent blood clots 30 tablet 0    . methocarbamol (ROBAXIN) 500 MG tablet Take 1 tablet (500 mg total) by mouth every 6 (six) hours as needed for muscle spasms. 40 tablet 1   . oxyCODONE (OXY IR/ROXICODONE) 5 MG immediate release tablet Take 1-3 tablets (5-15 mg total) by mouth every 3 (three) hours as needed for severe pain or breakthrough pain. 80 tablet 0    No current facility-administered medications for this visit.    Allergies  Allergen Reactions  . Other Anaphylaxis    Mayonnaise     Social History   Tobacco Use  . Smoking status: Current Every Day Smoker    Packs/day: 0.50    Years: 40.00    Pack years: 20.00  . Smokeless tobacco: Never Used  Substance Use Topics  . Alcohol use: Yes    Comment: glass of wine on weekends     No family history on file.   Review of Systems  Constitutional: Positive for diaphoresis.  HENT: Negative.   Eyes: Negative.   Respiratory: Negative.   Cardiovascular: Negative.   Gastrointestinal: Negative.   Genitourinary: Negative.   Musculoskeletal: Positive for back pain, joint pain and myalgias.  Skin: Negative.   Neurological: Negative.   Endo/Heme/Allergies: Negative.   Psychiatric/Behavioral: Negative.     Objective:  Physical Exam  Vitals reviewed. Constitutional: He is oriented to person, place, and time. He appears well-developed and well-nourished.  HENT:  Head: Normocephalic and atraumatic.  Eyes: Pupils are equal, round, and reactive to light. Conjunctivae and EOM are normal.  Neck: Normal range of motion.  No thyromegaly present.  Cardiovascular: Normal rate, regular rhythm and intact distal pulses.  Respiratory: Effort normal. No respiratory distress.  GI: Soft. He exhibits no distension.  Genitourinary:    Genitourinary Comments: deferred   Musculoskeletal:     Right hip: He exhibits decreased range of motion, decreased strength, bony tenderness, swelling and crepitus.  Neurological: He is alert and oriented to person, place, and time. He has  normal reflexes.  Skin: Skin is warm and dry.  Psychiatric: He has a normal mood and affect. His behavior is normal. Judgment and thought content normal.    Vital signs in last 24 hours: @VSRANGES @  Labs:   Estimated body mass index is 32.16 kg/m as calculated from the following:   Height as of 08/15/16: 5\' 9"  (1.753 m).   Weight as of 08/15/16: 98.8 kg.   Imaging Review Plain radiographs demonstrate severe degenerative joint disease of the right hip(s). The bone quality appears to be adequate for age and reported activity level.    Preoperative templating of the joint replacement has been completed, documented, and submitted to the Operating Room personnel in order to optimize intra-operative equipment management.     Assessment/Plan:  End stage arthritis, right hip(s)  The patient history, physical examination, clinical judgement of the provider and imaging studies are consistent with end stage degenerative joint disease of the right hip(s) and total hip arthroplasty is deemed medically necessary. The treatment options including medical management, injection therapy, arthroscopy and arthroplasty were discussed at length. The risks and benefits of total hip arthroplasty were presented and reviewed. The risks due to aseptic loosening, infection, stiffness, dislocation/subluxation,  thromboembolic complications and other imponderables were discussed.  The patient acknowledged the explanation, agreed to proceed with the plan and consent was signed. Patient is being admitted for inpatient treatment for surgery, pain control, PT, OT, prophylactic antibiotics, VTE prophylaxis, progressive ambulation and ADL's and discharge planning.The patient is planning to be discharged home with HEP

## 2018-11-13 NOTE — H&P (View-Only) (Signed)
TOTAL HIP ADMISSION H&P  Patient is admitted for right total hip arthroplasty.  Subjective:  Chief Complaint: right hip pain  HPI: [name], 63 y.o. male, has a history of pain and functional disability in the right hip(s) due to trauma and arthritis and patient has failed non-surgical conservative treatments for greater than 12 weeks to include NSAID's and/or analgesics, flexibility and strengthening excercises, use of assistive devices, weight reduction as appropriate and activity modification.  Onset of symptoms was gradual starting 5 years ago with rapidlly worsening course since that time.The patient noted no past surgery on the right hip(s).  Patient currently rates pain in the right hip at 10 out of 10 with activity. Patient has night pain, worsening of pain with activity and weight bearing, trendelenberg gait, pain that interfers with activities of daily living, pain with passive range of motion and crepitus. Patient has evidence of subchondral cysts, subchondral sclerosis, periarticular osteophytes and joint space narrowing by imaging studies. This condition presents safety issues increasing the risk of falls. There is no current active infection.  Patient Active Problem List   Diagnosis Date Noted  . S/P TKR (total knee replacement) using cement, right 08/15/2016   Past Medical History:  Diagnosis Date  . Arthritis   . Bone infection [facility])    age 89 - ? staph   . History of kidney stones    as a teenager     Past Surgical History:  Procedure Laterality Date  . JOINT REPLACEMENT     left hip - 20008   . TOTAL KNEE ARTHROPLASTY Right 08/15/2016   Procedure: TOTAL KNEE ARTHROPLASTY;  Surgeon: Latanya Maudlin, MD;  Location: [facility];  Service: Orthopedics;  Laterality: Right;    Current Outpatient Medications  Medication Sig Dispense Refill Last Dose  . aspirin EC 325 MG tablet Take 1 tablet (325 mg total) by mouth 2 (two) times daily. To prevent blood clots 30 tablet 0    . methocarbamol (ROBAXIN) 500 MG tablet Take 1 tablet (500 mg total) by mouth every 6 (six) hours as needed for muscle spasms. 40 tablet 1   . oxyCODONE (OXY IR/ROXICODONE) 5 MG immediate release tablet Take 1-3 tablets (5-15 mg total) by mouth every 3 (three) hours as needed for severe pain or breakthrough pain. 80 tablet 0    No current facility-administered medications for this visit.    Allergies  Allergen Reactions  . Other Anaphylaxis    Mayonnaise     Social History   Tobacco Use  . Smoking status: Current Every Day Smoker    Packs/day: 0.50    Years: 40.00    Pack years: 20.00  . Smokeless tobacco: Never Used  Substance Use Topics  . Alcohol use: Yes    Comment: glass of wine on weekends     No family history on file.   Review of Systems  Constitutional: Positive for diaphoresis.  HENT: Negative.   Eyes: Negative.   Respiratory: Negative.   Cardiovascular: Negative.   Gastrointestinal: Negative.   Genitourinary: Negative.   Musculoskeletal: Positive for back pain, joint pain and myalgias.  Skin: Negative.   Neurological: Negative.   Endo/Heme/Allergies: Negative.   Psychiatric/Behavioral: Negative.     Objective:  Physical Exam  Vitals reviewed. Constitutional: He is oriented to person, place, and time. He appears well-developed and well-nourished.  HENT:  Head: Normocephalic and atraumatic.  Eyes: Pupils are equal, round, and reactive to light. Conjunctivae and EOM are normal.  Neck: Normal range of motion.  No thyromegaly present.  Cardiovascular: Normal rate, regular rhythm and intact distal pulses.  Respiratory: Effort normal. No respiratory distress.  GI: Soft. He exhibits no distension.  Genitourinary:    Genitourinary Comments: deferred   Musculoskeletal:     Right hip: He exhibits decreased range of motion, decreased strength, bony tenderness, swelling and crepitus.  Neurological: He is alert and oriented to person, place, and time. He has  normal reflexes.  Skin: Skin is warm and dry.  Psychiatric: He has a normal mood and affect. His behavior is normal. Judgment and thought content normal.    Vital signs in last 24 hours: @VSRANGES @  Labs:   Estimated body mass index is 32.16 kg/m as calculated from the following:   Height as of 08/15/16: 5\' 9"  (1.753 m).   Weight as of 08/15/16: 98.8 kg.   Imaging Review Plain radiographs demonstrate severe degenerative joint disease of the right hip(s). The bone quality appears to be adequate for age and reported activity level.    Preoperative templating of the joint replacement has been completed, documented, and submitted to the Operating Room personnel in order to optimize intra-operative equipment management.     Assessment/Plan:  End stage arthritis, right hip(s)  The patient history, physical examination, clinical judgement of the provider and imaging studies are consistent with end stage degenerative joint disease of the right hip(s) and total hip arthroplasty is deemed medically necessary. The treatment options including medical management, injection therapy, arthroscopy and arthroplasty were discussed at length. The risks and benefits of total hip arthroplasty were presented and reviewed. The risks due to aseptic loosening, infection, stiffness, dislocation/subluxation,  thromboembolic complications and other imponderables were discussed.  The patient acknowledged the explanation, agreed to proceed with the plan and consent was signed. Patient is being admitted for inpatient treatment for surgery, pain control, PT, OT, prophylactic antibiotics, VTE prophylaxis, progressive ambulation and ADL's and discharge planning.The patient is planning to be discharged home with HEP

## 2018-11-27 NOTE — Progress Notes (Signed)
10-01-18 EKG and Surgical Clearance from Dr. Dagmar Hait on chart.

## 2018-11-27 NOTE — Patient Instructions (Addendum)
[name]  11/27/2018   Your procedure is scheduled on: 12-04-18    Report to [facility] Main  Entrance    Report to Admitting at 5:30 AM    Call this number if you have problems the morning of surgery [phone]    Remember: Do not eat food or drink liquids :After Midnight.    BRUSH YOUR TEETH MORNING OF SURGERY AND RINSE YOUR MOUTH OUT, NO CHEWING GUM CANDY OR MINTS.     Take these medicines the morning of surgery with A SIP OF WATER: None-per pt's preference                                You may not have any metal on your body including hair pins and              piercings  Do not wear jewelry, cologne, lotions, powders or deodorant             Men may shave face and neck.   Do not bring valuables to the hospital. [place].  Contacts, dentures or bridgework may not be worn into surgery.  Leave suitcase in the car. After surgery it may be brought to your room.     Patients discharged the day of surgery will not be allowed to drive home. IF YOU ARE HAVING SURGERY AND GOING HOME THE SAME DAY, YOU MUST HAVE AN ADULT TO DRIVE YOU HOME AND BE WITH YOU FOR 24 HOURS. YOU MAY GO HOME BY TAXI OR UBER OR ORTHERWISE, BUT AN ADULT MUST ACCOMPANY YOU HOME AND STAY WITH YOU FOR 24 HOURS.    Special Instructions: N/A              Please read over the following fact sheets you were given: _____________________________________________________________________             [facility] - Preparing for Surgery Before surgery, you can play an important role.  Because skin is not sterile, your skin needs to be as free of germs as possible.  You can reduce the number of germs on your skin by washing with CHG (chlorahexidine gluconate) soap before surgery.  CHG is an antiseptic cleaner which kills germs and bonds with the skin to continue killing germs even after washing. Please DO NOT use if you have an allergy to  CHG or antibacterial soaps.  If your skin becomes reddened/irritated stop using the CHG and inform your nurse when you arrive at Short Stay. Do not shave (including legs and underarms) for at least 48 hours prior to the first CHG shower.  You may shave your face/neck. Please follow these instructions carefully:  1.  Shower with CHG Soap the night before surgery and the  morning of Surgery.  2.  If you choose to wash your hair, wash your hair first as usual with your  normal  shampoo.  3.  After you shampoo, rinse your hair and body thoroughly to remove the  shampoo.                           4.  Use CHG as you would any other liquid soap.  You can apply chg  directly  to the skin and wash                       Gently with a scrungie or clean washcloth.  5.  Apply the CHG Soap to your body ONLY FROM THE NECK DOWN.   Do not use on face/ open                           Wound or open sores. Avoid contact with eyes, ears mouth and genitals (private parts).                       Wash face,  Genitals (private parts) with your normal soap.             6.  Wash thoroughly, paying special attention to the area where your surgery  will be performed.  7.  Thoroughly rinse your body with warm water from the neck down.  8.  DO NOT shower/wash with your normal soap after using and rinsing off  the CHG Soap.                9.  Pat yourself dry with a clean towel.            10.  Wear clean pajamas.            11.  Place clean sheets on your bed the night of your first shower and do not  sleep with pets. Day of Surgery : Do not apply any lotions/deodorants the morning of surgery.  Please wear clean clothes to the hospital/surgery center.  FAILURE TO FOLLOW THESE INSTRUCTIONS MAY RESULT IN THE CANCELLATION OF YOUR SURGERY PATIENT SIGNATURE_________________________________  NURSE SIGNATURE__________________________________  ________________________________________________________________________   Adam Phenix  An incentive spirometer is a tool that can help keep your lungs clear and active. This tool measures how well you are filling your lungs with each breath. Taking long deep breaths may help reverse or decrease the chance of developing breathing (pulmonary) problems (especially infection) following:  A long period of time when you are unable to move or be active. BEFORE THE PROCEDURE   If the spirometer includes an indicator to show your best effort, your nurse or respiratory therapist will set it to a desired goal.  If possible, sit up straight or lean slightly forward. Try not to slouch.  Hold the incentive spirometer in an upright position. INSTRUCTIONS FOR USE  1. Sit on the edge of your bed if possible, or sit up as far as you can in bed or on a chair. 2. Hold the incentive spirometer in an upright position. 3. Breathe out normally. 4. Place the mouthpiece in your mouth and seal your lips tightly around it. 5. Breathe in slowly and as deeply as possible, raising the piston or the ball toward the top of the column. 6. Hold your breath for 3-5 seconds or for as long as possible. Allow the piston or ball to fall to the bottom of the column. 7. Remove the mouthpiece from your mouth and breathe out normally. 8. Rest for a few seconds and repeat Steps 1 through 7 at least 10 times every 1-2 hours when you are awake. Take your time and take a few normal breaths between deep breaths. 9. The spirometer may include an indicator to show your best effort. Use the indicator as a goal to work toward during each repetition. 10.  After each set of 10 deep breaths, practice coughing to be sure your lungs are clear. If you have an incision (the cut made at the time of surgery), support your incision when coughing by placing a pillow or rolled up towels firmly against it. Once you are able to get out of bed, walk around indoors and cough well. You may stop using the incentive spirometer when  instructed by your caregiver.  RISKS AND COMPLICATIONS  Take your time so you do not get dizzy or light-headed.  If you are in pain, you may need to take or ask for pain medication before doing incentive spirometry. It is harder to take a deep breath if you are having pain. AFTER USE  Rest and breathe slowly and easily.  It can be helpful to keep track of a log of your progress. Your caregiver can provide you with a simple table to help with this. If you are using the spirometer at home, follow these instructions: [place] IF:   You are having difficultly using the spirometer.  You have trouble using the spirometer as often as instructed.  Your pain medication is not giving enough relief while using the spirometer.  You develop fever of 100.5 F (38.1 C) or higher. SEEK IMMEDIATE MEDICAL CARE IF:   You cough up bloody sputum that had not been present before.  You develop fever of 102 F (38.9 C) or greater.  You develop worsening pain at or near the incision site. MAKE SURE YOU:   Understand these instructions.  Will watch your condition.  Will get help right away if you are not doing well or get worse. Document Released: 02/18/2007 Document Revised: 12/31/2011 Document Reviewed: 04/21/2007 Exit[employer] Patient Information 2014 [employer], [place].   ________________________________________________________________________  WHAT IS A BLOOD TRANSFUSION? Blood Transfusion Information  A transfusion is the replacement of blood or some of its parts. Blood is made up of multiple cells which provide different functions.  Red blood cells carry oxygen and are used for blood loss replacement.  White blood cells fight against infection.  Platelets control bleeding.  Plasma helps clot blood.  Other blood products are available for specialized needs, such as hemophilia or other clotting disorders. BEFORE THE TRANSFUSION  Who gives blood for transfusions?   Healthy  volunteers who are fully evaluated to make sure their blood is safe. This is blood bank blood. Transfusion therapy is the safest it has ever been in the practice of medicine. Before blood is taken from a donor, a complete history is taken to make sure that person has no history of diseases nor engages in risky social behavior (examples are intravenous drug use or sexual activity with multiple partners). The donor's travel history is screened to minimize risk of transmitting infections, such as malaria. The donated blood is tested for signs of infectious diseases, such as HIV and hepatitis. The blood is then tested to be sure it is compatible with you in order to minimize the chance of a transfusion reaction. If you or a relative donates blood, this is often done in anticipation of surgery and is not appropriate for emergency situations. It takes many days to process the donated blood. RISKS AND COMPLICATIONS Although transfusion therapy is very safe and saves many lives, the main dangers of transfusion include:   Getting an infectious disease.  Developing a transfusion reaction. This is an allergic reaction to something in the blood you were given. Every precaution is taken to prevent this. The decision  to have a blood transfusion has been considered carefully by your caregiver before blood is given. Blood is not given unless the benefits outweigh the risks. AFTER THE TRANSFUSION  Right after receiving a blood transfusion, you will usually feel much better and more energetic. This is especially true if your red blood cells have gotten low (anemic). The transfusion raises the level of the red blood cells which carry oxygen, and this usually causes an energy increase.  The nurse administering the transfusion will monitor you carefully for complications. HOME CARE INSTRUCTIONS  No special instructions are needed after a transfusion. You may find your energy is better. Speak with your caregiver about any  limitations on activity for underlying diseases you may have. SEEK MEDICAL CARE IF:   Your condition is not improving after your transfusion.  You develop redness or irritation at the intravenous (IV) site. SEEK IMMEDIATE MEDICAL CARE IF:  Any of the following symptoms occur over the next 12 hours:  Shaking chills.  You have a temperature by mouth above 102 F (38.9 C), not controlled by medicine.  Chest, back, or muscle pain.  People around you feel you are not acting correctly or are confused.  Shortness of breath or difficulty breathing.  Dizziness and fainting.  You get a rash or develop hives.  You have a decrease in urine output.  Your urine turns a dark color or changes to pink, red, or brown. Any of the following symptoms occur over the next 10 days:  You have a temperature by mouth above 102 F (38.9 C), not controlled by medicine.  Shortness of breath.  Weakness after normal activity.  The white part of the eye turns yellow (jaundice).  You have a decrease in the amount of urine or are urinating less often.  Your urine turns a dark color or changes to pink, red, or brown. Document Released: 10/05/2000 Document Revised: 12/31/2011 Document Reviewed: 05/24/2008 [facility] Patient Information 2014 [place], [place].  _______________________________________________________________________

## 2018-11-28 ENCOUNTER — Encounter (HOSPITAL_COMMUNITY)
Admission: RE | Admit: 2018-11-28 | Discharge: 2018-11-28 | Disposition: A | Discharge status: Home / Self Care | Payer: Managed Care, Other (non HMO) | Source: Ambulatory Visit | Attending: Orthopedic Surgery | Admitting: Orthopedic Surgery

## 2018-11-28 ENCOUNTER — Encounter (HOSPITAL_COMMUNITY): Payer: Self-pay

## 2018-11-28 ENCOUNTER — Other Ambulatory Visit: Payer: Self-pay

## 2018-11-28 DIAGNOSIS — Z01812 Encounter for preprocedural laboratory examination: Secondary | ICD-10-CM | POA: Insufficient documentation

## 2018-11-28 DIAGNOSIS — M1611 Unilateral primary osteoarthritis, right hip: Secondary | ICD-10-CM | POA: Diagnosis not present

## 2018-11-28 HISTORY — DX: Essential (primary) hypertension: I10

## 2018-11-28 LAB — CBC
HCT: 42.3 % (ref 39.0–52.0)
Hemoglobin: 14 g/dL (ref 13.0–17.0)
MCH: 32.9 pg (ref 26.0–34.0)
MCHC: 33.1 g/dL (ref 30.0–36.0)
MCV: 99.5 fL (ref 80.0–100.0)
Platelets: 264 10*3/uL (ref 150–400)
RBC: 4.25 MIL/uL (ref 4.22–5.81)
RDW: 11.8 % (ref 11.5–15.5)
WBC: 9 10*3/uL (ref 4.0–10.5)
nRBC: 0 % (ref 0.0–0.2)

## 2018-11-28 LAB — BASIC METABOLIC PANEL
Anion gap: 7 (ref 5–15)
BUN: 9 mg/dL (ref 8–23)
CHLORIDE: 100 mmol/L (ref 98–111)
CO2: 25 mmol/L (ref 22–32)
Calcium: 9.5 mg/dL (ref 8.9–10.3)
Creatinine, Ser: 0.92 mg/dL (ref 0.61–1.24)
GFR calc Af Amer: >60 mL/min (ref >60)
GFR calc non Af Amer: >60 mL/min (ref >60)
Glucose, Bld: 101 mg/dL — ABNORMAL HIGH (ref 70–99)
Potassium: 4.3 mmol/L (ref 3.5–5.1)
SODIUM: 132 mmol/L — AB (ref 135–145)

## 2018-11-28 LAB — SURGICAL PCR SCREEN
MRSA, PCR: NEGATIVE
Staphylococcus aureus: NEGATIVE

## 2018-12-03 NOTE — Anesthesia Preprocedure Evaluation (Addendum)
Anesthesia Evaluation  Patient identified by MRN, date of birth, ID band Patient awake    Reviewed: Allergy & Precautions, NPO status , Patient's Chart, lab work & pertinent test results  History of Anesthesia Complications Negative for: history of anesthetic complications  Airway Mallampati: III  TM Distance: >3 FB Neck ROM: Limited    Dental  (+) Dental Advisory Given   Pulmonary COPD, former smoker (quit 2018),    breath sounds clear to auscultation       Cardiovascular hypertension, Pt. on medications (-) angina Rhythm:Regular Rate:Normal     Neuro/Psych S/p ACDF    GI/Hepatic negative GI ROS, Neg liver ROS,   Endo/Other  obese  Renal/GU negative Renal ROS     Musculoskeletal  (+) Arthritis ,   Abdominal (+) + obese,   Peds  Hematology negative hematology ROS (+) plt 264k   Anesthesia Other Findings   Reproductive/Obstetrics                            Anesthesia Physical Anesthesia Plan  ASA: II  Anesthesia Plan: Spinal   Post-op Pain Management:    Induction:   PONV Risk Score and Plan: 1 and Ondansetron  Airway Management Planned: Natural Airway and Nasal Cannula  Additional Equipment:   Intra-op Plan:   Post-operative Plan:   Informed Consent: I have reviewed the patients History and Physical, chart, labs and discussed the procedure including the risks, benefits and alternatives for the proposed anesthesia with the patient or authorized representative who has indicated his/her understanding and acceptance.     Dental advisory given  Plan Discussed with: CRNA and Surgeon  Anesthesia Plan Comments: (Plan routine monitors, SAB)       Anesthesia Quick Evaluation

## 2018-12-04 ENCOUNTER — Inpatient Hospital Stay (HOSPITAL_COMMUNITY)
Admission: RE | Admit: 2018-12-04 | Discharge: 2018-12-05 | DRG: 470 | Disposition: A | Discharge status: Home / Self Care | Payer: Managed Care, Other (non HMO) | Attending: Orthopedic Surgery | Admitting: Orthopedic Surgery

## 2018-12-04 ENCOUNTER — Inpatient Hospital Stay (HOSPITAL_COMMUNITY): Payer: Managed Care, Other (non HMO)

## 2018-12-04 ENCOUNTER — Other Ambulatory Visit: Payer: Self-pay

## 2018-12-04 ENCOUNTER — Encounter (HOSPITAL_COMMUNITY)
Admission: RE | Disposition: A | Discharge status: Home / Self Care | Payer: Self-pay | Source: Home / Self Care | Attending: Orthopedic Surgery

## 2018-12-04 ENCOUNTER — Inpatient Hospital Stay (HOSPITAL_COMMUNITY): Payer: Managed Care, Other (non HMO) | Admitting: Physician Assistant

## 2018-12-04 ENCOUNTER — Inpatient Hospital Stay (HOSPITAL_COMMUNITY): Payer: Managed Care, Other (non HMO) | Admitting: Anesthesiology

## 2018-12-04 ENCOUNTER — Encounter (HOSPITAL_COMMUNITY): Payer: Self-pay | Admitting: Emergency Medicine

## 2018-12-04 DIAGNOSIS — Z87891 Personal history of nicotine dependence: Secondary | ICD-10-CM | POA: Diagnosis not present

## 2018-12-04 DIAGNOSIS — J449 Chronic obstructive pulmonary disease, unspecified: Secondary | ICD-10-CM | POA: Diagnosis present

## 2018-12-04 DIAGNOSIS — Z96641 Presence of right artificial hip joint: Secondary | ICD-10-CM | POA: Diagnosis present

## 2018-12-04 DIAGNOSIS — Z6832 Body mass index (BMI) 32.0-32.9, adult: Secondary | ICD-10-CM | POA: Diagnosis not present

## 2018-12-04 DIAGNOSIS — Z7982 Long term (current) use of aspirin: Secondary | ICD-10-CM

## 2018-12-04 DIAGNOSIS — Z87442 Personal history of urinary calculi: Secondary | ICD-10-CM | POA: Diagnosis not present

## 2018-12-04 DIAGNOSIS — Z79899 Other long term (current) drug therapy: Secondary | ICD-10-CM

## 2018-12-04 DIAGNOSIS — M1611 Unilateral primary osteoarthritis, right hip: Secondary | ICD-10-CM | POA: Diagnosis present

## 2018-12-04 DIAGNOSIS — E669 Obesity, unspecified: Secondary | ICD-10-CM | POA: Diagnosis present

## 2018-12-04 DIAGNOSIS — Z91018 Allergy to other foods: Secondary | ICD-10-CM | POA: Diagnosis not present

## 2018-12-04 DIAGNOSIS — I10 Essential (primary) hypertension: Secondary | ICD-10-CM | POA: Diagnosis present

## 2018-12-04 DIAGNOSIS — Z96651 Presence of right artificial knee joint: Secondary | ICD-10-CM | POA: Diagnosis present

## 2018-12-04 DIAGNOSIS — M25559 Pain in unspecified hip: Secondary | ICD-10-CM

## 2018-12-04 DIAGNOSIS — Z09 Encounter for follow-up examination after completed treatment for conditions other than malignant neoplasm: Secondary | ICD-10-CM

## 2018-12-04 HISTORY — PX: TOTAL HIP ARTHROPLASTY: SHX124

## 2018-12-04 LAB — TYPE AND SCREEN
ABO/RH(D): O POS
Antibody Screen: NEGATIVE

## 2018-12-04 SURGERY — ARTHROPLASTY, HIP, TOTAL, ANTERIOR APPROACH
Anesthesia: Spinal | Site: Hip | Laterality: Right

## 2018-12-04 MED ORDER — PROPOFOL 10 MG/ML IV BOLUS
INTRAVENOUS | Status: AC
  Filled 2018-12-04: qty 20

## 2018-12-04 MED ORDER — KETOROLAC TROMETHAMINE 30 MG/ML IJ SOLN
INTRAMUSCULAR | Status: AC
  Filled 2018-12-04: qty 1

## 2018-12-04 MED ORDER — SODIUM CHLORIDE 0.9 % IR SOLN
Status: DC | PRN
  Administered 2018-12-04: 3000 mL
  Administered 2018-12-04: 1000 mL

## 2018-12-04 MED ORDER — MENTHOL 3 MG MT LOZG: 1.0000 | LOZENGE | OROMUCOSAL | Status: DC | PRN

## 2018-12-04 MED ORDER — ALUM & MAG HYDROXIDE-SIMETH 200-200-20 MG/5ML PO SUSP: 30.0000 mL | ORAL | Status: DC | PRN

## 2018-12-04 MED ORDER — HYDROCHLOROTHIAZIDE 25 MG PO TABS
25.0000 mg | ORAL_TABLET | Freq: Every day | ORAL | Status: DC
  Administered 2018-12-05: 25 mg via ORAL
  Filled 2018-12-04: qty 1

## 2018-12-04 MED ORDER — WATER FOR IRRIGATION, STERILE IR SOLN
Status: DC | PRN
  Administered 2018-12-04: 2000 mL

## 2018-12-04 MED ORDER — BUPIVACAINE HCL (PF) 0.25 % IJ SOLN
INTRAMUSCULAR | Status: AC
  Filled 2018-12-04: qty 30

## 2018-12-04 MED ORDER — GABAPENTIN 100 MG PO CAPS
100.0000 mg | ORAL_CAPSULE | Freq: Three times a day (TID) | ORAL | Status: DC | PRN
  Administered 2018-12-04: 100 mg via ORAL
  Filled 2018-12-04: qty 1

## 2018-12-04 MED ORDER — METHOCARBAMOL 500 MG IVPB - SIMPLE MED
INTRAVENOUS | Status: AC
  Filled 2018-12-04: qty 50

## 2018-12-04 MED ORDER — HYDROMORPHONE HCL 1 MG/ML IJ SOLN
0.2500 mg | INTRAMUSCULAR | Status: DC | PRN
  Administered 2018-12-04 (×4): 0.25 mg via INTRAVENOUS

## 2018-12-04 MED ORDER — MIDAZOLAM HCL 2 MG/2ML IJ SOLN: 0.5000 mg | Freq: Once | INTRAMUSCULAR | Status: DC | PRN

## 2018-12-04 MED ORDER — ACETAMINOPHEN 10 MG/ML IV SOLN
1000.0000 mg | INTRAVENOUS | Status: AC
  Administered 2018-12-04: 1000 mg via INTRAVENOUS
  Filled 2018-12-04: qty 100

## 2018-12-04 MED ORDER — ISOPROPYL ALCOHOL 70 % SOLN
Status: AC
  Filled 2018-12-04: qty 480

## 2018-12-04 MED ORDER — LACTATED RINGERS IV SOLN
INTRAVENOUS | Status: DC
  Administered 2018-12-04 (×2): via INTRAVENOUS

## 2018-12-04 MED ORDER — CEFAZOLIN SODIUM-DEXTROSE 2-4 GM/100ML-% IV SOLN
2.0000 g | INTRAVENOUS | Status: AC
  Administered 2018-12-04: 2 g via INTRAVENOUS
  Filled 2018-12-04: qty 100

## 2018-12-04 MED ORDER — HYDROCODONE-ACETAMINOPHEN 5-325 MG PO TABS
1.0000 | ORAL_TABLET | ORAL | Status: DC | PRN
  Administered 2018-12-04 – 2018-12-05 (×3): 2 via ORAL
  Administered 2018-12-05: 1 via ORAL
  Filled 2018-12-04 (×2): qty 2
  Filled 2018-12-04: qty 1
  Filled 2018-12-04: qty 2

## 2018-12-04 MED ORDER — MORPHINE SULFATE (PF) 2 MG/ML IV SOLN
0.5000 mg | INTRAVENOUS | Status: DC | PRN
  Administered 2018-12-04: 0.5 mg via INTRAVENOUS
  Filled 2018-12-04: qty 1

## 2018-12-04 MED ORDER — DEXAMETHASONE SODIUM PHOSPHATE 10 MG/ML IJ SOLN
INTRAMUSCULAR | Status: DC | PRN
  Administered 2018-12-04: 8 mg via INTRAVENOUS

## 2018-12-04 MED ORDER — CHLORHEXIDINE GLUCONATE 4 % EX LIQD: 60.0000 mL | Freq: Once | CUTANEOUS | Status: DC

## 2018-12-04 MED ORDER — ISOPROPYL ALCOHOL 70 % SOLN
Status: DC | PRN
  Administered 2018-12-04: 1 via TOPICAL

## 2018-12-04 MED ORDER — PROPOFOL 10 MG/ML IV BOLUS
INTRAVENOUS | Status: DC | PRN
  Administered 2018-12-04 (×5): 10 mg via INTRAVENOUS

## 2018-12-04 MED ORDER — ONDANSETRON HCL 4 MG/2ML IJ SOLN: 4.0000 mg | Freq: Four times a day (QID) | INTRAMUSCULAR | Status: DC | PRN

## 2018-12-04 MED ORDER — ONDANSETRON HCL 4 MG/2ML IJ SOLN
INTRAMUSCULAR | Status: AC
  Filled 2018-12-04: qty 2

## 2018-12-04 MED ORDER — ALBUMIN HUMAN 5 % IV SOLN
12.5000 g | Freq: Once | INTRAVENOUS | Status: AC
  Administered 2018-12-04: 12.5 g via INTRAVENOUS

## 2018-12-04 MED ORDER — MIDAZOLAM HCL 2 MG/2ML IJ SOLN
INTRAMUSCULAR | Status: DC | PRN
  Administered 2018-12-04 (×2): 1 mg via INTRAVENOUS

## 2018-12-04 MED ORDER — HYDROMORPHONE HCL 1 MG/ML IJ SOLN
INTRAMUSCULAR | Status: AC
  Filled 2018-12-04: qty 1

## 2018-12-04 MED ORDER — HYDROCODONE-ACETAMINOPHEN 7.5-325 MG PO TABS: 1.0000 | ORAL_TABLET | ORAL | Status: DC | PRN

## 2018-12-04 MED ORDER — DOCUSATE SODIUM 100 MG PO CAPS
100.0000 mg | ORAL_CAPSULE | Freq: Two times a day (BID) | ORAL | Status: DC
  Administered 2018-12-04 – 2018-12-05 (×2): 100 mg via ORAL
  Filled 2018-12-04 (×2): qty 1

## 2018-12-04 MED ORDER — PROPOFOL 10 MG/ML IV BOLUS
INTRAVENOUS | Status: AC
  Filled 2018-12-04: qty 80

## 2018-12-04 MED ORDER — SODIUM CHLORIDE 0.9 % IV SOLN
INTRAVENOUS | Status: DC
  Administered 2018-12-04 – 2018-12-05 (×4): via INTRAVENOUS

## 2018-12-04 MED ORDER — DEXAMETHASONE SODIUM PHOSPHATE 10 MG/ML IJ SOLN
INTRAMUSCULAR | Status: AC
  Filled 2018-12-04: qty 1

## 2018-12-04 MED ORDER — METHOCARBAMOL 1000 MG/10ML IJ SOLN
500.0000 mg | Freq: Four times a day (QID) | INTRAVENOUS | Status: DC | PRN
  Filled 2018-12-04: qty 5

## 2018-12-04 MED ORDER — POLYETHYLENE GLYCOL 3350 17 G PO PACK: 17.0000 g | PACK | Freq: Every day | ORAL | Status: DC | PRN

## 2018-12-04 MED ORDER — DIPHENHYDRAMINE HCL 12.5 MG/5ML PO ELIX: 12.5000 mg | ORAL_SOLUTION | ORAL | Status: DC | PRN

## 2018-12-04 MED ORDER — PHENYLEPHRINE 40 MCG/ML (10ML) SYRINGE FOR IV PUSH (FOR BLOOD PRESSURE SUPPORT)
PREFILLED_SYRINGE | INTRAVENOUS | Status: AC
  Filled 2018-12-04: qty 10

## 2018-12-04 MED ORDER — PROPOFOL 500 MG/50ML IV EMUL
INTRAVENOUS | Status: DC | PRN
  Administered 2018-12-04: 125 ug/kg/min via INTRAVENOUS

## 2018-12-04 MED ORDER — POVIDONE-IODINE 10 % EX SWAB
2.0000 "application " | Freq: Once | CUTANEOUS | Status: AC
  Administered 2018-12-04: 2 via TOPICAL

## 2018-12-04 MED ORDER — METOCLOPRAMIDE HCL 5 MG PO TABS: 5.0000 mg | ORAL_TABLET | Freq: Three times a day (TID) | ORAL | Status: DC | PRN

## 2018-12-04 MED ORDER — ACETAMINOPHEN 325 MG PO TABS: 325.0000 mg | ORAL_TABLET | Freq: Four times a day (QID) | ORAL | Status: DC | PRN

## 2018-12-04 MED ORDER — CEFAZOLIN SODIUM-DEXTROSE 2-4 GM/100ML-% IV SOLN
2.0000 g | Freq: Four times a day (QID) | INTRAVENOUS | Status: AC
  Administered 2018-12-04 (×2): 2 g via INTRAVENOUS
  Filled 2018-12-04 (×2): qty 100

## 2018-12-04 MED ORDER — PROMETHAZINE HCL 25 MG/ML IJ SOLN: 6.2500 mg | INTRAMUSCULAR | Status: DC | PRN

## 2018-12-04 MED ORDER — MIDAZOLAM HCL 2 MG/2ML IJ SOLN
INTRAMUSCULAR | Status: AC
  Filled 2018-12-04: qty 2

## 2018-12-04 MED ORDER — METHOCARBAMOL 500 MG IVPB - SIMPLE MED
500.0000 mg | Freq: Once | INTRAVENOUS | Status: AC
  Administered 2018-12-04: 500 mg via INTRAVENOUS

## 2018-12-04 MED ORDER — ONDANSETRON HCL 4 MG PO TABS: 4.0000 mg | ORAL_TABLET | Freq: Four times a day (QID) | ORAL | Status: DC | PRN

## 2018-12-04 MED ORDER — PHENOL 1.4 % MT LIQD: 1.0000 | OROMUCOSAL | Status: DC | PRN

## 2018-12-04 MED ORDER — BUPIVACAINE HCL (PF) 0.25 % IJ SOLN
INTRAMUSCULAR | Status: DC | PRN
  Administered 2018-12-04: 30 mL

## 2018-12-04 MED ORDER — IRBESARTAN 150 MG PO TABS
150.0000 mg | ORAL_TABLET | Freq: Every day | ORAL | Status: DC
  Administered 2018-12-05: 150 mg via ORAL
  Filled 2018-12-04: qty 1

## 2018-12-04 MED ORDER — MEPERIDINE HCL 50 MG/ML IJ SOLN: 6.2500 mg | INTRAMUSCULAR | Status: DC | PRN

## 2018-12-04 MED ORDER — KETOROLAC TROMETHAMINE 15 MG/ML IJ SOLN
7.5000 mg | Freq: Four times a day (QID) | INTRAMUSCULAR | Status: AC
  Administered 2018-12-04 – 2018-12-05 (×4): 7.5 mg via INTRAVENOUS
  Filled 2018-12-04 (×4): qty 1

## 2018-12-04 MED ORDER — ASPIRIN 81 MG PO CHEW
81.0000 mg | CHEWABLE_TABLET | Freq: Two times a day (BID) | ORAL | Status: DC
  Administered 2018-12-04 – 2018-12-05 (×2): 81 mg via ORAL
  Filled 2018-12-04 (×2): qty 1

## 2018-12-04 MED ORDER — KETOROLAC TROMETHAMINE 30 MG/ML IJ SOLN
INTRAMUSCULAR | Status: DC | PRN
  Administered 2018-12-04: 30 mg

## 2018-12-04 MED ORDER — SODIUM CHLORIDE 0.9 % IV SOLN: INTRAVENOUS | Status: DC

## 2018-12-04 MED ORDER — BUPIVACAINE IN DEXTROSE 0.75-8.25 % IT SOLN
INTRATHECAL | Status: DC | PRN
  Administered 2018-12-04: 15 mg via INTRATHECAL

## 2018-12-04 MED ORDER — 0.9 % SODIUM CHLORIDE (POUR BTL) OPTIME
TOPICAL | Status: DC | PRN
  Administered 2018-12-04: 1000 mL

## 2018-12-04 MED ORDER — SODIUM CHLORIDE (PF) 0.9 % IJ SOLN
INTRAMUSCULAR | Status: DC | PRN
  Administered 2018-12-04: 30 mL

## 2018-12-04 MED ORDER — DEXAMETHASONE SODIUM PHOSPHATE 10 MG/ML IJ SOLN
10.0000 mg | Freq: Once | INTRAMUSCULAR | Status: AC
  Administered 2018-12-05: 10 mg via INTRAVENOUS
  Filled 2018-12-04: qty 1

## 2018-12-04 MED ORDER — KETOROLAC TROMETHAMINE 30 MG/ML IJ SOLN
30.0000 mg | Freq: Once | INTRAMUSCULAR | Status: AC
  Administered 2018-12-04: 30 mg via INTRAVENOUS

## 2018-12-04 MED ORDER — ALBUMIN HUMAN 5 % IV SOLN
INTRAVENOUS | Status: AC
  Administered 2018-12-04: 12.5 g via INTRAVENOUS
  Filled 2018-12-04: qty 250

## 2018-12-04 MED ORDER — ONDANSETRON HCL 4 MG/2ML IJ SOLN
INTRAMUSCULAR | Status: DC | PRN
  Administered 2018-12-04: 4 mg via INTRAVENOUS

## 2018-12-04 MED ORDER — EPHEDRINE 5 MG/ML INJ
INTRAVENOUS | Status: AC
  Filled 2018-12-04: qty 10

## 2018-12-04 MED ORDER — TRANEXAMIC ACID-NACL 1000-0.7 MG/100ML-% IV SOLN
1000.0000 mg | INTRAVENOUS | Status: AC
  Administered 2018-12-04: 1000 mg via INTRAVENOUS
  Filled 2018-12-04: qty 100

## 2018-12-04 MED ORDER — METOCLOPRAMIDE HCL 5 MG/ML IJ SOLN: 5.0000 mg | Freq: Three times a day (TID) | INTRAMUSCULAR | Status: DC | PRN

## 2018-12-04 MED ORDER — METHOCARBAMOL 500 MG PO TABS
500.0000 mg | ORAL_TABLET | Freq: Four times a day (QID) | ORAL | Status: DC | PRN
  Administered 2018-12-04: 500 mg via ORAL
  Filled 2018-12-04: qty 1

## 2018-12-04 MED ORDER — EPHEDRINE SULFATE-NACL 50-0.9 MG/10ML-% IV SOSY
PREFILLED_SYRINGE | INTRAVENOUS | Status: DC | PRN
  Administered 2018-12-04: 5 mg via INTRAVENOUS
  Administered 2018-12-04: 10 mg via INTRAVENOUS

## 2018-12-04 MED ORDER — SENNA 8.6 MG PO TABS
1.0000 | ORAL_TABLET | Freq: Two times a day (BID) | ORAL | Status: DC
  Administered 2018-12-04 – 2018-12-05 (×2): 8.6 mg via ORAL
  Filled 2018-12-04 (×2): qty 1

## 2018-12-04 MED ORDER — ROSUVASTATIN CALCIUM 10 MG PO TABS
10.0000 mg | ORAL_TABLET | Freq: Every day | ORAL | Status: DC
  Administered 2018-12-04 – 2018-12-05 (×2): 10 mg via ORAL
  Filled 2018-12-04 (×2): qty 1

## 2018-12-04 MED ORDER — PHENYLEPHRINE 40 MCG/ML (10ML) SYRINGE FOR IV PUSH (FOR BLOOD PRESSURE SUPPORT)
PREFILLED_SYRINGE | INTRAVENOUS | Status: DC | PRN
  Administered 2018-12-04 (×2): 120 ug via INTRAVENOUS

## 2018-12-04 MED ORDER — SODIUM CHLORIDE (PF) 0.9 % IJ SOLN
INTRAMUSCULAR | Status: AC
  Filled 2018-12-04: qty 50

## 2018-12-04 SURGICAL SUPPLY — 55 items
BAG DECANTER FOR FLEXI CONT (MISCELLANEOUS) IMPLANT
BAG ZIPLOCK 12X15 (MISCELLANEOUS) IMPLANT
BLADE SURG SZ10 CARB STEEL (BLADE) ×6 IMPLANT
CHLORAPREP W/TINT 26ML (MISCELLANEOUS) ×3 IMPLANT
CLOTH BEACON ORANGE TIMEOUT ST (SAFETY) ×3 IMPLANT
COVER PERINEAL POST (MISCELLANEOUS) ×3 IMPLANT
COVER SURGICAL LIGHT HANDLE (MISCELLANEOUS) ×3 IMPLANT
COVER WAND RF STERILE (DRAPES) IMPLANT
CUP ACET PNNCL SECTR W/GRIP 56 (Hips) ×1 IMPLANT
DECANTER SPIKE VIAL GLASS SM (MISCELLANEOUS) ×3 IMPLANT
DERMABOND ADVANCED (GAUZE/BANDAGES/DRESSINGS) ×4
DERMABOND ADVANCED .7 DNX12 (GAUZE/BANDAGES/DRESSINGS) ×2 IMPLANT
DRAPE IMP U-DRAPE 54X76 (DRAPES) ×3 IMPLANT
DRAPE SHEET LG 3/4 BI-LAMINATE (DRAPES) ×9 IMPLANT
DRAPE STERI IOBAN 125X83 (DRAPES) ×3 IMPLANT
DRAPE U-SHAPE 47X51 STRL (DRAPES) ×6 IMPLANT
DRSG AQUACEL AG ADV 3.5X10 (GAUZE/BANDAGES/DRESSINGS) ×3 IMPLANT
ELECT PENCIL ROCKER SW 15FT (MISCELLANEOUS) ×3 IMPLANT
ELECT REM PT RETURN 15FT ADLT (MISCELLANEOUS) ×3 IMPLANT
GAUZE SPONGE 4X4 12PLY STRL (GAUZE/BANDAGES/DRESSINGS) ×3 IMPLANT
GLOVE BIO SURGEON STRL SZ8.5 (GLOVE) ×6 IMPLANT
GLOVE BIOGEL PI IND STRL 8.5 (GLOVE) ×1 IMPLANT
GLOVE BIOGEL PI INDICATOR 8.5 (GLOVE) ×2
GOWN SPEC L3 XXLG W/TWL (GOWN DISPOSABLE) ×3 IMPLANT
HANDPIECE INTERPULSE COAX TIP (DISPOSABLE) ×2
HEAD CERAMIC DELTA 36 PLUS 1.5 (Hips) ×3 IMPLANT
HOLDER FOLEY CATH W/STRAP (MISCELLANEOUS) ×3 IMPLANT
HOOD PEEL AWAY FLYTE STAYCOOL (MISCELLANEOUS) ×12 IMPLANT
LINER NEUTRAL 52MMX36MMX56N (Liner) ×3 IMPLANT
MANIFOLD NEPTUNE II (INSTRUMENTS) ×3 IMPLANT
MARKER SKIN DUAL TIP RULER LAB (MISCELLANEOUS) ×3 IMPLANT
NDL SAFETY ECLIPSE 18X1.5 (NEEDLE) ×1 IMPLANT
NEEDLE HYPO 18GX1.5 SHARP (NEEDLE) ×2
NEEDLE SPNL 18GX3.5 QUINCKE PK (NEEDLE) ×3 IMPLANT
PACK ANTERIOR HIP CUSTOM (KITS) ×3 IMPLANT
PINN SECTOR W/GRIP ACE CUP 56 (Hips) ×3 IMPLANT
SAW OSC TIP CART 19.5X105X1.3 (SAW) ×3 IMPLANT
SEALER BIPOLAR AQUA 6.0 (INSTRUMENTS) ×3 IMPLANT
SET HNDPC FAN SPRY TIP SCT (DISPOSABLE) ×1 IMPLANT
STEM TRI LOC BPS SZ7 W GRIPTON (Hips) ×1 IMPLANT
SUT ETHIBOND NAB CT1 #1 30IN (SUTURE) ×6 IMPLANT
SUT MNCRL AB 3-0 PS2 18 (SUTURE) ×3 IMPLANT
SUT MNCRL AB 4-0 PS2 18 (SUTURE) ×3 IMPLANT
SUT MON AB 2-0 CT1 36 (SUTURE) ×6 IMPLANT
SUT STRATAFIX PDO 1 14 VIOLET (SUTURE) ×2
SUT STRATFX PDO 1 14 VIOLET (SUTURE) ×1
SUT VIC AB 2-0 CT1 27 (SUTURE) ×2
SUT VIC AB 2-0 CT1 TAPERPNT 27 (SUTURE) ×1 IMPLANT
SUTURE STRATFX PDO 1 14 VIOLET (SUTURE) ×1 IMPLANT
SYR 3ML LL SCALE MARK (SYRINGE) ×6 IMPLANT
SYR 50ML LL SCALE MARK (SYRINGE) ×3 IMPLANT
TRAY FOLEY MTR SLVR 16FR STAT (SET/KITS/TRAYS/PACK) IMPLANT
TRI LOC BPS SZ 7 W GRIPTON (Hips) ×3 IMPLANT
WATER STERILE IRR 1000ML POUR (IV SOLUTION) ×3 IMPLANT
YANKAUER SUCT BULB TIP 10FT TU (MISCELLANEOUS) ×3 IMPLANT

## 2018-12-04 NOTE — Anesthesia Postprocedure Evaluation (Signed)
Anesthesia Post Note  Patient: Tayveon L Cinquemani  Procedure(s) Performed: TOTAL HIP ARTHROPLASTY ANTERIOR APPROACH (Right Hip)     Patient location during evaluation: PACU Anesthesia Type: Spinal Level of consciousness: awake and alert, patient cooperative and oriented Pain management: pain level controlled Vital Signs Assessment: post-procedure vital signs reviewed and stable Respiratory status: spontaneous breathing, nonlabored ventilation and respiratory function stable Cardiovascular status: blood pressure returned to baseline and stable Postop Assessment: spinal receding, patient able to bend at knees and no apparent nausea or vomiting Comments: L arm pain resolved to 2/10 pain score by the time pt able to leave PACU with SAB resolution.     Last Vitals:  Vitals:   12/04/18 1333 12/04/18 1431  BP: (!) 106/58 119/75  Pulse: (!) 56 65  Resp:  17  Temp: 36.4 C (!) 36.4 C  SpO2: 95% 97%    Last Pain:  Vitals:   12/04/18 1431  TempSrc: Oral  PainSc:                  Sophia Sperry,E. Nisha Dhami

## 2018-12-04 NOTE — Anesthesia Procedure Notes (Addendum)
Procedure Name: MAC Date/Time: 12/04/2018 7:36 AM Performed by: Niel Hummer, CRNA Pre-anesthesia Checklist: Emergency Drugs available, Patient identified, Suction available and Patient being monitored Patient Re-evaluated:Patient Re-evaluated prior to induction Oxygen Delivery Method: Simple face mask

## 2018-12-04 NOTE — Anesthesia Procedure Notes (Signed)
Spinal  Patient location during procedure: OR End time: 12/04/2018 7:55 AM Staffing Anesthesiologist: Annye Asa, MD Performed: anesthesiologist  Preanesthetic Checklist Completed: patient identified, site marked, surgical consent, pre-op evaluation, timeout performed, IV checked, risks and benefits discussed and monitors and equipment checked Spinal Block Patient position: sitting Prep: site prepped and draped and DuraPrep Patient monitoring: blood pressure, continuous pulse ox, cardiac monitor and heart rate Approach: midline Location: L3-4 Injection technique: single-shot Needle Needle type: Pencan  Needle gauge: 24 G Needle length: 9 cm Additional Notes Pt identified in Operating room.  Monitors applied. Working IV access confirmed. Sterile prep, drape lumbar spine.  1% lido local L 3,4, after several attempts by CRNA, #24ga Pencan into clear CSF L 3,4.  15mg  0.75% Bupivacaine with dextrose injected with asp CSF beginning and end of injection.  Patient asymptomatic, VSS, no heme aspirated, tolerated well.  Jenita Seashore, MD

## 2018-12-04 NOTE — Transfer of Care (Signed)
Immediate Anesthesia Transfer of Care Note  Patient: [name]  Procedure(s) Performed: TOTAL HIP ARTHROPLASTY ANTERIOR APPROACH (Right Hip)  Patient Location: PACU  Anesthesia Type:Spinal  Level of Consciousness: awake, alert  and oriented  Airway & Oxygen Therapy: Patient Spontanous Breathing and Patient connected to face mask oxygen  Post-op Assessment: Report given to RN and Post -op Vital signs reviewed and stable  Post vital signs: Reviewed and stable  Last Vitals:  Vitals Value Taken Time  BP 174/115 12/04/2018 10:38 AM  Temp    Pulse 64 12/04/2018 10:39 AM  Resp 16 12/04/2018 10:39 AM  SpO2 96 % 12/04/2018 10:39 AM  Vitals shown include unvalidated device data.  Last Pain:  Vitals:   12/04/18 0604  TempSrc:   PainSc: 0-No pain      Patients Stated Pain Goal: 4 (69/48/54 [id number])  Complications: No apparent anesthesia complications

## 2018-12-04 NOTE — Interval H&P Note (Signed)
History and Physical Interval Note:  12/04/2018 7:29 AM  [name]  has presented today for surgery, with the diagnosis of Right hip osteoarthritis  The various methods of treatment have been discussed with the patient and family. After consideration of risks, benefits and other options for treatment, the patient has consented to  Procedure(s): TOTAL HIP ARTHROPLASTY ANTERIOR APPROACH (Right) as a surgical intervention .  The patient's history has been reviewed, patient examined, no change in status, stable for surgery.  I have reviewed the patient's chart and labs.  Questions were answered to the patient's satisfaction.     [name]

## 2018-12-04 NOTE — Evaluation (Signed)
Physical Therapy Evaluation Patient Details Name: [name] MRN: [id number] DOB: [birth year] Today's Date: 12/04/2018   History of Present Illness  63 yo male s/p R DA-THA on 12/04/18. PMH includes TKR 2017, OA, L THA 2008.   Clinical Impression  Pt presents with R hip pain, suspected post-surgical R hip weakness, difficulty performing bed mobility, increased time and effort to perform mobility tasks. Pt to benefit from acute PT to address deficits. Pt ambulated 100 ft with RW with min guard assist, verbal cuing provided for safety and form. Pt educated on ankle pumps (20/hour) to perform this afternoon/evening to increase circulation, to pt's tolerance and limited by pain. PT to progress mobility as tolerated, and will continue to follow acutely.        Follow Up Recommendations Follow surgeon's recommendation for DC plan and follow-up therapies;Supervision for mobility/OOB(HEP)    Equipment Recommendations  None recommended by PT    Recommendations for Other Services       Precautions / Restrictions Precautions Precautions: Fall Restrictions Weight Bearing Restrictions: No Other Position/Activity Restrictions: WBAT       Mobility  Bed Mobility Overal bed mobility: Needs Assistance Bed Mobility: Supine to Sit;Sit to Supine     Supine to sit: Min assist;HOB elevated Sit to supine: Min assist;HOB elevated   General bed mobility comments: Min assist for supine<>Sit for RLE lifting and translation to and from EOB. Increased time to perform. Verbal cuing for sequencing.   Transfers Overall transfer level: Needs assistance Equipment used: Rolling walker (2 wheeled) Transfers: Sit to/from Stand Sit to Stand: Min guard;From elevated surface         General transfer comment: Min guard for safety. VC for hand placement. Pt reporting feeling lightheaded immediately after standing, stopped within seconds of standing.   Ambulation/Gait Ambulation/Gait assistance: Min  guard;+2 safety/equipment(chair follow) Gait Distance (Feet): 100 Feet Assistive device: Rolling walker (2 wheeled) Gait Pattern/deviations: Step-through pattern;Step-to pattern;Decreased weight shift to right;Trunk flexed Gait velocity: decr    General Gait Details: Min guard for safety. Verbal cuing for upright posture including hip extension to neutral, placement in RW.   Stairs            Wheelchair Mobility    Modified Rankin (Stroke Patients Only)       Balance Overall balance assessment: Mild deficits observed, not formally tested                                           Pertinent Vitals/Pain Pain Assessment: 0-10 Pain Score: 5  Pain Location: R hip  Pain Descriptors / Indicators: Sore Pain Intervention(s): Limited activity within patient's tolerance;Repositioned;Ice applied;Monitored during session;Premedicated before session;Patient requesting pain meds-RN notified    Home Living Family/patient expects to be discharged to:: Private residence Living Arrangements: Alone Available Help at Discharge: Friend(s);Available PRN/intermittently Type of Home: House Home Access: Stairs to enter Entrance Stairs-Rails: Psychiatric nurse of Steps: 3 Home Layout: One level Home Equipment: Walker - 2 wheels;Cane - single point;Crutches      Prior Function Level of Independence: Independent         Comments: Pt states he was doing everything for himself PTA, does not want any other equipment      Hand Dominance   Dominant Hand: Right    Extremity/Trunk Assessment   Upper Extremity Assessment Upper Extremity Assessment: Overall WFL for tasks assessed    Lower  Extremity Assessment Lower Extremity Assessment: Overall WFL for tasks assessed;RLE deficits/detail RLE Deficits / Details: suspected post-surgical hip weakness; able to perform ankle pumps, quad set, heel slide  RLE Sensation: decreased light touch;WNL(decreased light  touch of R great toe)    Cervical / Trunk Assessment Cervical / Trunk Assessment: Normal  Communication   Communication: No difficulties  Cognition Arousal/Alertness: Awake/alert Behavior During Therapy: WFL for tasks assessed/performed Overall Cognitive Status: Within Functional Limits for tasks assessed                                        General Comments      Exercises     Assessment/Plan    PT Assessment Patient needs continued PT services  PT Problem List Decreased strength;Pain;Decreased activity tolerance;Decreased knowledge of use of DME;Decreased balance;Decreased mobility       PT Treatment Interventions DME instruction;Therapeutic activities;Gait training;Therapeutic exercise;Patient/family education;Balance training;Stair training;Functional mobility training    PT Goals (Current goals can be found in the Care Plan section)  Acute Rehab PT Goals Patient Stated Goal: return to PLOF PT Goal Formulation: With patient Time For Goal Achievement: 12/11/18 Potential to Achieve Goals: Good    Frequency 7X/week   Barriers to discharge        Co-evaluation               AM-PAC PT "6 Clicks" Mobility  Outcome Measure Help needed turning from your back to your side while in a flat bed without using bedrails?: A Little Help needed moving from lying on your back to sitting on the side of a flat bed without using bedrails?: A Little Help needed moving to and from a bed to a chair (including a wheelchair)?: A Little Help needed standing up from a chair using your arms (e.g., wheelchair or bedside chair)?: A Little Help needed to walk in hospital room?: A Little Help needed climbing 3-5 steps with a railing? : A Little 6 Click Score: 18    End of Session Equipment Utilized During Treatment: Gait belt Activity Tolerance: Patient tolerated treatment well Patient left: in bed;with bed alarm set;with call bell/phone within reach;with SCD's  reapplied Nurse Communication: Mobility status PT Visit Diagnosis: Other abnormalities of gait and mobility (R26.89);Difficulty in walking, not elsewhere classified (R26.2)    Time: 4401-0272 PT Time Calculation (min) (ACUTE ONLY): 29 min   Charges:   PT Evaluation $PT Eval Low Complexity: 1 Low PT Treatments $Gait Training: 8-22 mins        Julien Girt, PT Acute Rehabilitation Services Pager [phone]  Office [phone]   [name] 12/04/2018, 5:06 PM

## 2018-12-04 NOTE — Discharge Instructions (Signed)
°Dr. Shaton Lore °Joint Replacement Specialist °[facility] °[address] °[place], [place] [address] °[phone] ° ° °TOTAL HIP REPLACEMENT POSTOPERATIVE DIRECTIONS ° ° ° °Hip Rehabilitation, Guidelines Following Surgery  ° °WEIGHT BEARING °Weight bearing as tolerated with assist device (walker, cane, etc) as directed, use it as long as suggested by your surgeon or therapist, typically at least 4-6 weeks. ° °The results of a hip operation are greatly improved after range of motion and muscle strengthening exercises. Follow all safety measures which are given to protect your hip. If any of these exercises cause increased pain or swelling in your joint, decrease the amount until you are comfortable again. Then slowly increase the exercises. Call your caregiver if you have problems or questions.  ° °HOME CARE INSTRUCTIONS  °Most of the following instructions are designed to prevent the dislocation of your new hip.  °Remove items at home which could result in a fall. This includes throw rugs or furniture in walking pathways.  °Continue medications as instructed at time of discharge. °· You may have some home medications which will be placed on hold until you complete the course of blood thinner medication. °· You may start showering once you are discharged home. Do not remove your dressing. °Do not put on socks or shoes without following the instructions of your caregivers.   °Sit on chairs with arms. Use the chair arms to help push yourself up when arising.  °Arrange for the use of a toilet seat elevator so you are not sitting low.  °· Walk with walker as instructed.  °You may resume a sexual relationship in one month or when given the OK by your caregiver.  °Use walker as long as suggested by your caregivers.  °You may put full weight on your legs and walk as much as is comfortable. °Avoid periods of inactivity such as sitting longer than an hour when not asleep. This helps prevent  blood clots.  °You may return to work once you are cleared by your surgeon.  °Do not drive a car for 6 weeks or until released by your surgeon.  °Do not drive while taking narcotics.  °Wear elastic stockings for two weeks following surgery during the day but you may remove then at night.  °Make sure you keep all of your appointments after your operation with all of your doctors and caregivers. You should call the office at the above phone number and make an appointment for approximately two weeks after the date of your surgery. °Please pick up a stool softener and laxative for home use as long as you are requiring pain medications. °· ICE to the affected hip every three hours for 30 minutes at a time and then as needed for pain and swelling. Continue to use ice on the hip for pain and swelling from surgery. You may notice swelling that will progress down to the foot and ankle.  This is normal after surgery.  Elevate the leg when you are not up walking on it.   °It is important for you to complete the blood thinner medication as prescribed by your doctor. °· Continue to use the breathing machine which will help keep your temperature down.  It is common for your temperature to cycle up and down following surgery, especially at night when you are not up moving around and exerting yourself.  The breathing machine keeps your lungs expanded and your temperature down. ° °RANGE OF MOTION AND STRENGTHENING EXERCISES  °These exercises are   designed to help you keep full movement of your hip joint. Follow your caregiver's or physical therapist's instructions. Perform all exercises about fifteen times, three times per day or as directed. Exercise both hips, even if you have had only one joint replacement. These exercises can be done on a training (exercise) mat, on the floor, on a table or on a bed. Use whatever works the best and is most comfortable for you. Use music or television while you are exercising so that the exercises  are a pleasant break in your day. This will make your life better with the exercises acting as a break in routine you can look forward to.  °Lying on your back, slowly slide your foot toward your buttocks, raising your knee up off the floor. Then slowly slide your foot back down until your leg is straight again.  °Lying on your back spread your legs as far apart as you can without causing discomfort.  °Lying on your side, raise your upper leg and foot straight up from the floor as far as is comfortable. Slowly lower the leg and repeat.  °Lying on your back, tighten up the muscle in the front of your thigh (quadriceps muscles). You can do this by keeping your leg straight and trying to raise your heel off the floor. This helps strengthen the largest muscle supporting your knee.  °Lying on your back, tighten up the muscles of your buttocks both with the legs straight and with the knee bent at a comfortable angle while keeping your heel on the floor.  ° °SKILLED REHAB INSTRUCTIONS: °If the patient is transferred to a skilled rehab facility following release from the hospital, a list of the current medications will be sent to the facility for the patient to continue.  When discharged from the skilled rehab facility, please have the facility set up the patient's Home Health Physical Therapy prior to being released. Also, the skilled facility will be responsible for providing the patient with their medications at time of release from the facility to include their pain medication and their blood thinner medication. If the patient is still at the rehab facility at time of the two week follow up appointment, the skilled rehab facility will also need to assist the patient in arranging follow up appointment in our office and any transportation needs. ° °MAKE SURE YOU:  °Understand these instructions.  °Will watch your condition.  °Will get help right away if you are not doing well or get worse. ° °Pick up stool softner and  laxative for home use following surgery while on pain medications. °Do not remove your dressing. °The dressing is waterproof--it is OK to take showers. °Continue to use ice for pain and swelling after surgery. °Do not use any lotions or creams on the incision until instructed by your surgeon. °Total Hip Protocol. ° ° °

## 2018-12-04 NOTE — Op Note (Signed)
OPERATIVE REPORT  SURGEON: Rod Can, MD   ASSISTANT: Nehemiah Massed, PA-C.  PREOPERATIVE DIAGNOSIS: Right hip arthritis.   POSTOPERATIVE DIAGNOSIS: Right hip arthritis.   PROCEDURE: Right total hip arthroplasty, anterior approach.   IMPLANTS: DePuy Tri Lock stem, size 7, hi offset. DePuy Pinnacle Cup, size 56 mm. DePuy Altrx liner, size 36 by 56 mm, neutral. DePuy Biolox ceramic head ball, size 36 + 1.5 mm.  ANESTHESIA:  MAC and Spinal  ESTIMATED BLOOD LOSS:-100 mL    ANTIBIOTICS: 2 g Ancef.  DRAINS: None.  COMPLICATIONS: None.   CONDITION: PACU - hemodynamically stable.   BRIEF CLINICAL NOTE: [name] is a 63 y.o. male with a long-standing history of Right hip arthritis. After failing conservative management, the patient was indicated for total hip arthroplasty. The risks, benefits, and alternatives to the procedure were explained, and the patient elected to proceed.  PROCEDURE IN DETAIL: Surgical site was marked by myself in the pre-op holding area. Once inside the operating room, spinal anesthesia was obtained, and a foley catheter was inserted. The patient was then positioned on the Hana table. All bony prominences were well padded. The hip was prepped and draped in the normal sterile surgical fashion. A time-out was called verifying side and site of surgery. The patient received IV antibiotics within 60 minutes of beginning the procedure.  The direct anterior approach to the hip was performed through the Hueter interval. Lateral femoral circumflex vessels were treated with the Auqumantys. The anterior capsule was exposed and an inverted T capsulotomy was made.The femoral neck cut was made to the level of the templated cut. A corkscrew was placed into the head and the head was removed. The femoral head was found to have eburnated bone. The head was passed to the back table and was measured.  Acetabular exposure was achieved, and the pulvinar and  labrum were excised. Sequential reaming of the acetabulum was then performed up to a size 55 mm reamer. A 56 mm cup was then opened and impacted into place at approximately 40 degrees of abduction and 20 degrees of anteversion. The final polyethylene liner was impacted into place and acetabular osteophytes were removed.   I then gained femoral exposure taking care to protect the abductors and greater trochanter. This was performed using standard external rotation, extension, and adduction. The capsule was peeled off the inner aspect of the greater trochanter, taking care to preserve the short external rotators. A cookie cutter was used to enter the femoral canal, and then the femoral canal finder was placed. Sequential broaching was performed up to a size 7. Calcar planer was used on the femoral neck remnant. I placed a hi offset neck and a trial head ball. The hip was reduced. Leg lengths and offset were checked fluoroscopically. The hip was dislocated and trial components were removed. The final implants were placed, and the hip was reduced.  Fluoroscopy was used to confirm component position and leg lengths. At 90 degrees of external rotation and full extension, the hip was stable to an anterior directed force.  The wound was copiously irrigated with normal saline using pulse lavage. Marcaine solution was injected into the periarticular soft tissue. The wound was closed in layers using #1 Vicryl and V-Loc for the fascia, 2-0 Vicryl for the subcutaneous fat, 2-0 Monocryl for the deep dermal layer, 3-0 running Monocryl subcuticular stitch, and Dermabond for the skin. Once the glue was fully dried, an Aquacell Ag dressing was applied. The patient was transported to the  recovery room in stable condition. Sponge, needle, and instrument counts were correct at the end of the case x2. The patient tolerated the procedure well and there were no known complications.  Please note that a surgical  assistant was a medical necessity for this procedure to perform it in a safe and expeditious manner. Assistant was necessary to provide appropriate retraction of vital neurovascular structures, to prevent femoral fracture, and to allow for anatomic placement of the prosthesis.

## 2018-12-05 ENCOUNTER — Encounter (HOSPITAL_COMMUNITY): Payer: Self-pay | Admitting: Orthopedic Surgery

## 2018-12-05 LAB — CBC
HCT: 34.9 % — ABNORMAL LOW (ref 39.0–52.0)
HEMOGLOBIN: 11.3 g/dL — AB (ref 13.0–17.0)
MCH: 32 pg (ref 26.0–34.0)
MCHC: 32.4 g/dL (ref 30.0–36.0)
MCV: 98.9 fL (ref 80.0–100.0)
Platelets: 207 10*3/uL (ref 150–400)
RBC: 3.53 MIL/uL — ABNORMAL LOW (ref 4.22–5.81)
RDW: 11.5 % (ref 11.5–15.5)
WBC: 13.7 10*3/uL — ABNORMAL HIGH (ref 4.0–10.5)
nRBC: 0 % (ref 0.0–0.2)

## 2018-12-05 LAB — BASIC METABOLIC PANEL
Anion gap: 5 (ref 5–15)
BUN: 14 mg/dL (ref 8–23)
CO2: 23 mmol/L (ref 22–32)
CREATININE: 0.97 mg/dL (ref 0.61–1.24)
Calcium: 8.7 mg/dL — ABNORMAL LOW (ref 8.9–10.3)
Chloride: 104 mmol/L (ref 98–111)
GFR calc Af Amer: >60 mL/min (ref >60)
GFR calc non Af Amer: >60 mL/min (ref >60)
Glucose, Bld: 142 mg/dL — ABNORMAL HIGH (ref 70–99)
Potassium: 4.4 mmol/L (ref 3.5–5.1)
Sodium: 132 mmol/L — ABNORMAL LOW (ref 135–145)

## 2018-12-05 MED ORDER — METHOCARBAMOL 500 MG PO TABS: 500.0000 mg | ORAL_TABLET | Freq: Four times a day (QID) | ORAL | 0 refills | Status: DC | PRN

## 2018-12-05 MED ORDER — OXYCODONE-ACETAMINOPHEN 5-325 MG PO TABS: 1.0000 | ORAL_TABLET | ORAL | 0 refills | Status: AC | PRN

## 2018-12-05 MED ORDER — ASPIRIN 81 MG PO CHEW: 81.0000 mg | CHEWABLE_TABLET | Freq: Two times a day (BID) | ORAL | 1 refills | Status: DC

## 2018-12-05 MED ORDER — ONDANSETRON HCL 4 MG PO TABS: 4.0000 mg | ORAL_TABLET | Freq: Four times a day (QID) | ORAL | 0 refills | Status: DC | PRN

## 2018-12-05 MED ORDER — SENNA 8.6 MG PO TABS: 2.0000 | ORAL_TABLET | Freq: Every day | ORAL | 1 refills | Status: DC

## 2018-12-05 MED ORDER — DOCUSATE SODIUM 100 MG PO CAPS: 100.0000 mg | ORAL_CAPSULE | Freq: Two times a day (BID) | ORAL | 1 refills | Status: DC

## 2018-12-05 NOTE — Progress Notes (Signed)
    Subjective:  Patient reports pain as moderate.  Denies N/V/CP/SOB. States norco inadequate  Objective:   VITALS:   Vitals:   12/04/18 1622 12/04/18 2141 12/05/18 0135 12/05/18 0544  BP: 118/71 (!) 126/53 123/66 131/63  Pulse: 61 70 (!) 59 (!) 55  Resp: 16 16 16 16   Temp: 97.7 F (36.5 C) 97.8 F (36.6 C) 97.8 F (36.6 C) 97.9 F (36.6 C)  TempSrc: Oral Oral Oral Oral  SpO2: 98% 97% 97% 99%  Weight:      Height:        NAD ABD soft Sensation intact distally Intact pulses distally Dorsiflexion/Plantar flexion intact Incision: dressing C/D/I Compartment soft   Lab Results  Component Value Date   WBC 13.7 (H) 12/05/2018   HGB 11.3 (L) 12/05/2018   HCT 34.9 (L) 12/05/2018   MCV 98.9 12/05/2018   PLT 207 12/05/2018   BMET    Component Value Date/Time   NA 132 (L) 12/05/2018 0507   K 4.4 12/05/2018 0507   CL 104 12/05/2018 0507   CO2 23 12/05/2018 0507   GLUCOSE 142 (H) 12/05/2018 0507   BUN 14 12/05/2018 0507   CREATININE 0.97 12/05/2018 0507   CALCIUM 8.7 (L) 12/05/2018 0507   GFRNONAA >60 12/05/2018 0507   GFRAA >60 12/05/2018 0507     Assessment/Plan: 1 Day Post-Op   Principal Problem:   Osteoarthritis of right hip   WBAT with walker DVT ppx: Aspirin, SCDs, TEDS PO pain control: will d/c on oxy PT/OT Dispo: D/C home with HEP   Hilton Cork Ulonda Klosowski 12/05/2018, 8:05 AM   Rod Can, MD Cell: [phone] [place] [facility]  [place] [address], [place], [place] [address] Phone: [phone] [email or web address] Facebook  [employer]

## 2018-12-05 NOTE — Discharge Summary (Signed)
Physician Discharge Summary  Patient ID: DEQUINCY BORN MRN: [id number] DOB/AGE: [birth year] 63 y.o.  Admit date: 12/04/2018 Discharge date: 12/05/2018  Admission Diagnoses:  Osteoarthritis of right hip  Discharge Diagnoses:  Principal Problem:   Osteoarthritis of right hip   Past Medical History:  Diagnosis Date  . Arthritis   . Bone infection [facility])    age 63 - ? staph   . History of kidney stones    as a teenager   . Hypertension     Surgeries: Procedure(s): TOTAL HIP ARTHROPLASTY ANTERIOR APPROACH on 12/04/2018   Consultants (if any):   Discharged Condition: Improved  Hospital Course: KRAYTON WORTLEY is an 63 y.o. male who was admitted 12/04/2018 with a diagnosis of Osteoarthritis of right hip and went to the operating room on 12/04/2018 and underwent the above named procedures.    He was given perioperative antibiotics:  Anti-infectives (From admission, onward)   Start     Dose/Rate Route Frequency Ordered Stop   12/04/18 1400  ceFAZolin (ANCEF) IVPB 2g/100 mL premix     2 g 200 mL/hr over 30 Minutes Intravenous Every 6 hours 12/04/18 1336 12/04/18 2027   12/04/18 0600  ceFAZolin (ANCEF) IVPB 2g/100 mL premix     2 g 200 mL/hr over 30 Minutes Intravenous On call to O.R. 12/04/18 9937 12/04/18 1696    .  He was given sequential compression devices, early ambulation, and ASA for DVT prophylaxis.  He benefited maximally from the hospital stay and there were no complications.    Recent vital signs:  Vitals:   12/05/18 0544 12/05/18 0853  BP: 131/63 119/61  Pulse: (!) 55 64  Resp: 16   Temp: 97.9 F (36.6 C)   SpO2: 99%     Recent laboratory studies:  Lab Results  Component Value Date   HGB 11.3 (L) 12/05/2018   HGB 14.0 11/28/2018   HGB 12.2 (L) 08/17/2016   Lab Results  Component Value Date   WBC 13.7 (H) 12/05/2018   PLT 207 12/05/2018   Lab Results  Component Value Date   INR 0.96 08/10/2016   Lab Results  Component Value Date   NA 132  (L) 12/05/2018   K 4.4 12/05/2018   CL 104 12/05/2018   CO2 23 12/05/2018   BUN 14 12/05/2018   CREATININE 0.97 12/05/2018   GLUCOSE 142 (H) 12/05/2018    Discharge Medications:   Allergies as of 12/05/2018      Reactions   Other Anaphylaxis, Other (See Comments)   Mayonnaise       Medication List    STOP taking these medications   traMADol 50 MG tablet Commonly known as:  ULTRAM     TAKE these medications   aspirin 81 MG chewable tablet Chew 1 tablet (81 mg total) by mouth 2 (two) times daily.   docusate sodium 100 MG capsule Commonly known as:  COLACE Take 1 capsule (100 mg total) by mouth 2 (two) times daily.   gabapentin 100 MG capsule Commonly known as:  NEURONTIN Take 100 mg by mouth 3 (three) times daily as needed (pain).   hydrochlorothiazide 25 MG tablet Commonly known as:  HYDRODIURIL Take 25 mg by mouth daily.   meloxicam 15 MG tablet Commonly known as:  MOBIC Take 15 mg by mouth daily.   methocarbamol 500 MG tablet Commonly known as:  ROBAXIN Take 1 tablet (500 mg total) by mouth every 6 (six) hours as needed for muscle spasms.   ondansetron 4 MG  tablet Commonly known as:  ZOFRAN Take 1 tablet (4 mg total) by mouth every 6 (six) hours as needed for nausea.   oxyCODONE-acetaminophen 5-325 MG tablet Commonly known as:  PERCOCET Take 1 tablet by mouth every 4 (four) hours as needed for severe pain.   rosuvastatin 10 MG tablet Commonly known as:  CRESTOR Take 10 mg by mouth daily.   senna 8.6 MG Tabs tablet Commonly known as:  SENOKOT Take 2 tablets (17.2 mg total) by mouth at bedtime.   telmisartan 40 MG tablet Commonly known as:  MICARDIS Take 40 mg by mouth daily.       Diagnostic Studies: Dg Pelvis Portable  Result Date: 12/04/2018 CLINICAL DATA:  Postop right hip arthroplasty. EXAM: PORTABLE PELVIS 1-2 VIEWS COMPARISON:  Operative images obtained earlier today. FINDINGS: Right total hip arthroplasty appears well seated and well  aligned. Left hip total arthroplasty is also well-seated and aligned. No acute fracture. SI joints and symphysis pubis are normally spaced and aligned. IMPRESSION: 1. Well-positioned right total hip arthroplasty. No evidence of an operative complication. Electronically Signed   By: Lajean Manes M.D.   On: 12/04/2018 12:34   Dg C-arm 1-60 Min-no Report  Result Date: 12/04/2018 CLINICAL DATA:  Fluoro for right total hip replacement, anterior approach, Dr. Lyla Glassing, 13 secs fluoro time EXAM: OPERATIVE RIGHT HIP (WITH PELVIS IF PERFORMED) 2 VIEWS TECHNIQUE: Fluoroscopic spot image(s) were submitted for interpretation post-operatively. COMPARISON:  06/08/2010 FINDINGS: Two submitted portable operative images show a right total hip arthroplasty. The femoroacetabular components are well-seated and aligned. Is no acute fracture or evidence of an operative complication. IMPRESSION: Well-positioned total right hip arthroplasty. Electronically Signed   By: Lajean Manes M.D.   On: 12/04/2018 11:54   Dg Hip Operative Unilat W Or W/o Pelvis Right  Result Date: 12/04/2018 CLINICAL DATA:  Fluoro for right total hip replacement, anterior approach, Dr. Lyla Glassing, 13 secs fluoro time EXAM: OPERATIVE RIGHT HIP (WITH PELVIS IF PERFORMED) 2 VIEWS TECHNIQUE: Fluoroscopic spot image(s) were submitted for interpretation post-operatively. COMPARISON:  06/08/2010 FINDINGS: Two submitted portable operative images show a right total hip arthroplasty. The femoroacetabular components are well-seated and aligned. Is no acute fracture or evidence of an operative complication. IMPRESSION: Well-positioned total right hip arthroplasty. Electronically Signed   By: Lajean Manes M.D.   On: 12/04/2018 11:54    Disposition: Discharge disposition: 01-Home or Self Care       Discharge Instructions    Call MD / Call 911   Complete by:  As directed    If you experience chest pain or shortness of breath, CALL 911 and be transported to the  hospital emergency room.  If you develope a fever above 101 F, pus (white drainage) or increased drainage or redness at the wound, or calf pain, call your surgeon's office.   Constipation Prevention   Complete by:  As directed    Drink plenty of fluids.  Prune juice may be helpful.  You may use a stool softener, such as Colace (over the counter) 100 mg twice a day.  Use MiraLax (over the counter) for constipation as needed.   Diet - low sodium heart healthy   Complete by:  As directed    Driving restrictions   Complete by:  As directed    No driving for 6 weeks   Increase activity slowly as tolerated   Complete by:  As directed    Lifting restrictions   Complete by:  As directed    No  lifting for 6 weeks   TED hose   Complete by:  As directed    Use stockings (TED hose) for 2 weeks on both leg(s).  You may remove them at night for sleeping.      Follow-up Information    Schedule an appointment as soon as possible for a visit with Rod Can, MD.   Specialty:  Orthopedic Surgery Why:  For wound re-check Contact information: [address] [place] [address] [phone]            Signed: Hilton Cork Elchanan Bob 12/05/2018, 1:39 PM

## 2018-12-05 NOTE — Progress Notes (Signed)
Physical Therapy Treatment Patient Details Name: [name] MRN: [id number] DOB: July 25, 1956 Today's Date: 12/05/2018    History of Present Illness 63 yo male s/p R DA-THA on 12/04/18. PMH includes TKR 2017, OA, L THA 2008.     PT Comments    Pt has met PT goals and is ready to DC home from PT standpoint. He ambulated 160' with RW, completed stair training, and demonstrates understanding of THA HEP.   Follow Up Recommendations  Follow surgeon's recommendation for DC plan and follow-up therapies;Supervision for mobility/OOB(HEP)     Equipment Recommendations  None recommended by PT    Recommendations for Other Services       Precautions / Restrictions Precautions Precautions: Fall Restrictions Weight Bearing Restrictions: No Other Position/Activity Restrictions: WBAT     Mobility  Bed Mobility Overal bed mobility: Needs Assistance Bed Mobility: Supine to Sit;Sit to Supine     Supine to sit: Modified independent (Device/Increase time)     General bed mobility comments: used rail  Transfers Overall transfer level: Needs assistance Equipment used: Rolling walker (2 wheeled) Transfers: Sit to/from Stand Sit to Stand: Supervision         General transfer comment: VCs hand placement  Ambulation/Gait Ambulation/Gait assistance: Supervision Gait Distance (Feet): 160 Feet Assistive device: Rolling walker (2 wheeled) Gait Pattern/deviations: Step-through pattern;Step-to pattern;Decreased weight shift to right Gait velocity: decr    General Gait Details: steady, no loss of balance   Stairs Stairs: Yes Stairs assistance: Supervision Stair Management: One rail Right;Step to pattern;With cane Number of Stairs: 5 General stair comments: VCs sequencing   Wheelchair Mobility    Modified Rankin (Stroke Patients Only)       Balance Overall balance assessment: Modified Independent                                          Cognition  Arousal/Alertness: Awake/alert Behavior During Therapy: WFL for tasks assessed/performed Overall Cognitive Status: Within Functional Limits for tasks assessed                                        Exercises Total Joint Exercises Ankle Circles/Pumps: AROM;Both;10 reps;Supine Quad Sets: AROM;Right;10 reps;Supine Short Arc Quad: AROM;Right;10 reps;Supine Heel Slides: Right;10 reps;Supine;AROM;AAROM Hip ABduction/ADduction: AROM;AAROM;Right;10 reps;Supine Long Arc Quad: AROM;Right;10 reps;Seated    General Comments        Pertinent Vitals/Pain Pain Score: 4  Pain Location: R hip  Pain Descriptors / Indicators: Sore Pain Intervention(s): Limited activity within patient's tolerance;Monitored during session;Ice applied    Home Living                      Prior Function            PT Goals (current goals can now be found in the care plan section) Acute Rehab PT Goals Patient Stated Goal: go on a cruise, exercise in pool PT Goal Formulation: With patient/family Time For Goal Achievement: 12/11/18 Potential to Achieve Goals: Good Progress towards PT goals: Progressing toward goals    Frequency    7X/week      PT Plan Current plan remains appropriate    Co-evaluation              AM-PAC PT "6 Clicks" Mobility   Outcome Measure  Help needed  turning from your back to your side while in a flat bed without using bedrails?: None Help needed moving from lying on your back to sitting on the side of a flat bed without using bedrails?: None Help needed moving to and from a bed to a chair (including a wheelchair)?: None Help needed standing up from a chair using your arms (e.g., wheelchair or bedside chair)?: None Help needed to walk in hospital room?: None Help needed climbing 3-5 steps with a railing? : A Little 6 Click Score: 23    End of Session Equipment Utilized During Treatment: Gait belt Activity Tolerance: Patient tolerated treatment  well Patient left: with call bell/phone within reach;in chair;with chair alarm set;with family/visitor present Nurse Communication: Mobility status PT Visit Diagnosis: Other abnormalities of gait and mobility (R26.89);Difficulty in walking, not elsewhere classified (R26.2)     Time: 8757-9728 PT Time Calculation (min) (ACUTE ONLY): 17 min  Charges:  $Gait Training: 8-22 mins                     Blondell Reveal Kistler PT 12/05/2018  Acute Rehabilitation Services Pager [phone] Office [phone]

## 2019-09-01 DIAGNOSIS — Z0100 Encounter for examination of eyes and vision without abnormal findings: Secondary | ICD-10-CM | POA: Diagnosis not present

## 2019-09-30 DIAGNOSIS — R69 Illness, unspecified: Secondary | ICD-10-CM | POA: Diagnosis not present

## 2019-11-11 DIAGNOSIS — R69 Illness, unspecified: Secondary | ICD-10-CM | POA: Diagnosis not present

## 2019-11-11 IMAGING — DX DG PORTABLE PELVIS
2 series · 2 of 2 positions shown · non-contrast
Comparison: Operative images obtained earlier today.

CLINICAL DATA: Postop right hip arthroplasty.

EXAM:
PORTABLE PELVIS 1-2 VIEWS

[pelvis ap (1 of 2)]
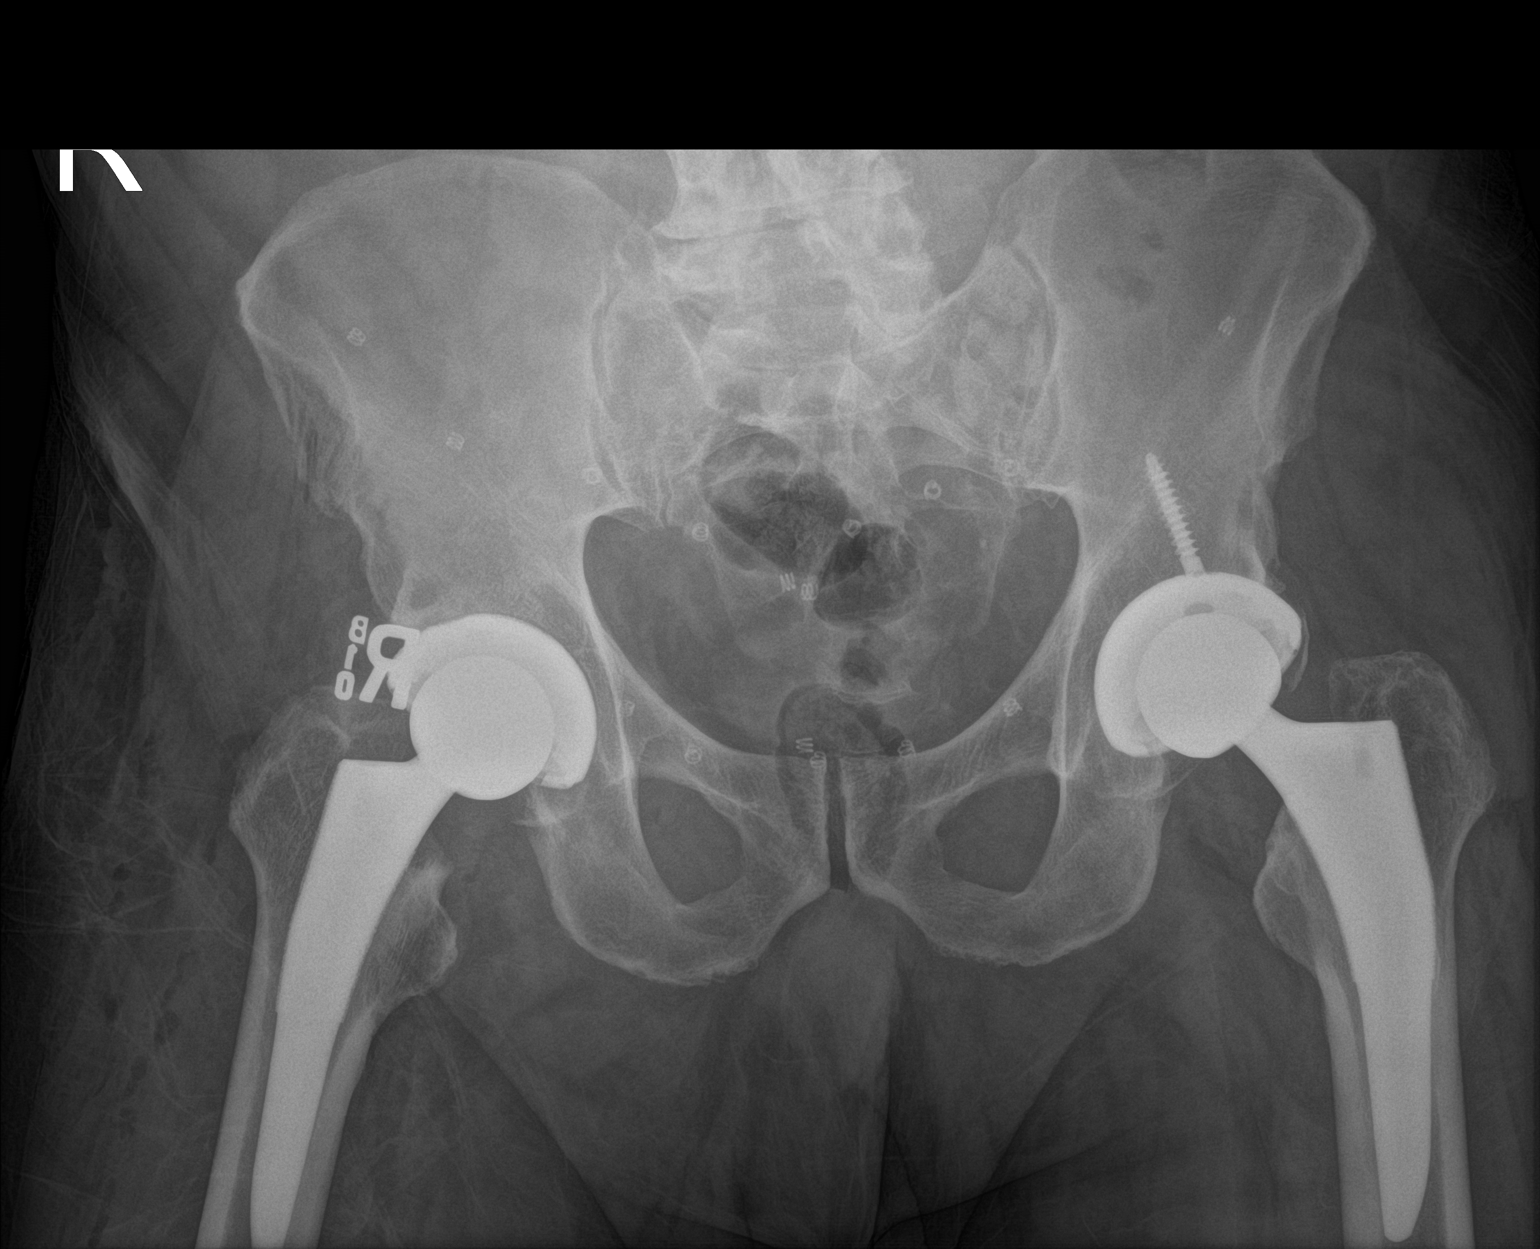

[pelvis ap (2 of 2)]
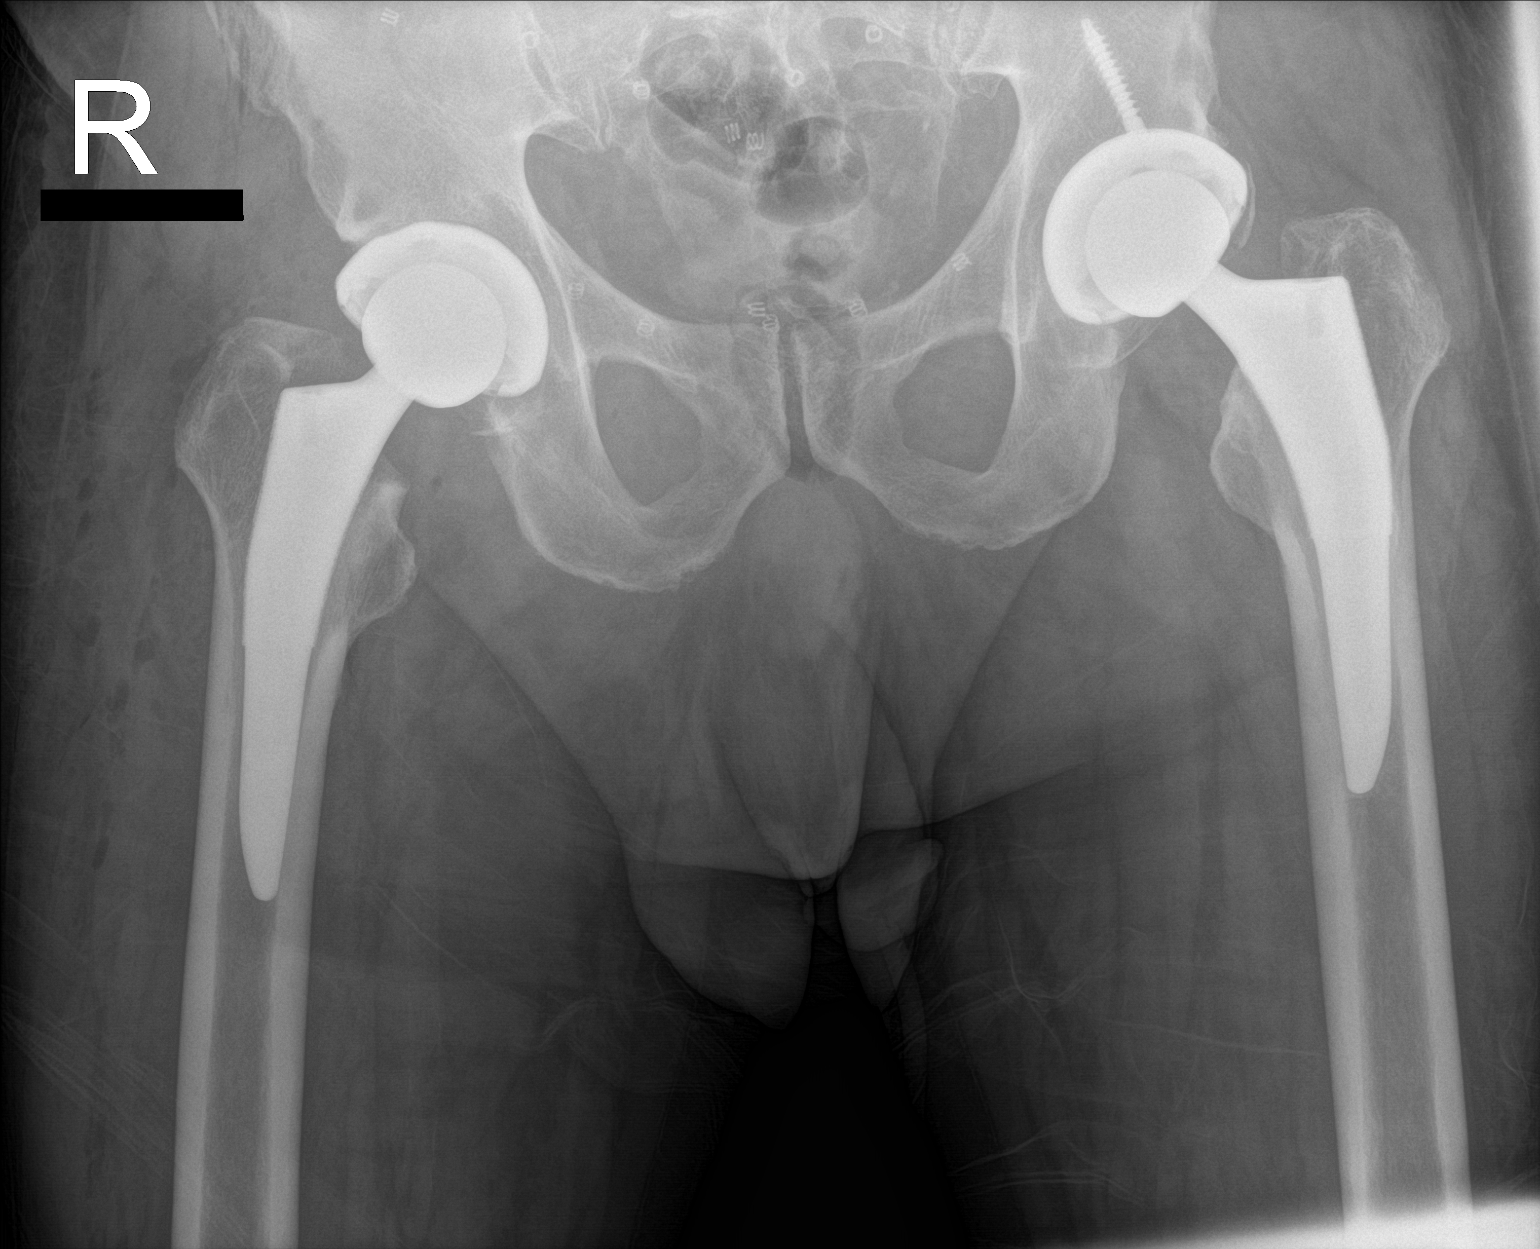

[2 of 2 positions shown; findings below may reference images not displayed]

FINDINGS: Right total hip arthroplasty appears well seated and well aligned.
Left hip total arthroplasty is also well-seated and aligned.

No acute fracture. SI joints and symphysis pubis are normally spaced
and aligned.
IMPRESSION: 1. Well-positioned right total hip arthroplasty. No evidence of an
operative complication.

## 2019-11-24 DIAGNOSIS — R0683 Snoring: Secondary | ICD-10-CM | POA: Diagnosis not present

## 2019-11-24 DIAGNOSIS — E669 Obesity, unspecified: Secondary | ICD-10-CM | POA: Diagnosis not present

## 2019-11-24 DIAGNOSIS — R0609 Other forms of dyspnea: Secondary | ICD-10-CM | POA: Diagnosis not present

## 2019-11-24 DIAGNOSIS — R209 Unspecified disturbances of skin sensation: Secondary | ICD-10-CM | POA: Diagnosis not present

## 2019-11-24 DIAGNOSIS — N400 Enlarged prostate without lower urinary tract symptoms: Secondary | ICD-10-CM | POA: Diagnosis not present

## 2019-11-24 DIAGNOSIS — Z8249 Family history of ischemic heart disease and other diseases of the circulatory system: Secondary | ICD-10-CM | POA: Diagnosis not present

## 2019-11-24 DIAGNOSIS — I10 Essential (primary) hypertension: Secondary | ICD-10-CM | POA: Diagnosis not present

## 2019-11-24 DIAGNOSIS — R7301 Impaired fasting glucose: Secondary | ICD-10-CM | POA: Diagnosis not present

## 2019-11-24 DIAGNOSIS — R109 Unspecified abdominal pain: Secondary | ICD-10-CM | POA: Diagnosis not present

## 2019-11-24 DIAGNOSIS — R69 Illness, unspecified: Secondary | ICD-10-CM | POA: Diagnosis not present

## 2019-12-01 DIAGNOSIS — R209 Unspecified disturbances of skin sensation: Secondary | ICD-10-CM | POA: Diagnosis not present

## 2019-12-01 DIAGNOSIS — E7849 Other hyperlipidemia: Secondary | ICD-10-CM | POA: Diagnosis not present

## 2019-12-01 DIAGNOSIS — N400 Enlarged prostate without lower urinary tract symptoms: Secondary | ICD-10-CM | POA: Diagnosis not present

## 2019-12-01 DIAGNOSIS — I10 Essential (primary) hypertension: Secondary | ICD-10-CM | POA: Diagnosis not present

## 2019-12-01 DIAGNOSIS — R69 Illness, unspecified: Secondary | ICD-10-CM | POA: Diagnosis not present

## 2019-12-01 DIAGNOSIS — R109 Unspecified abdominal pain: Secondary | ICD-10-CM | POA: Diagnosis not present

## 2019-12-01 DIAGNOSIS — Z8249 Family history of ischemic heart disease and other diseases of the circulatory system: Secondary | ICD-10-CM | POA: Diagnosis not present

## 2019-12-01 DIAGNOSIS — R7301 Impaired fasting glucose: Secondary | ICD-10-CM | POA: Diagnosis not present

## 2019-12-01 DIAGNOSIS — R0683 Snoring: Secondary | ICD-10-CM | POA: Diagnosis not present

## 2019-12-01 DIAGNOSIS — R0609 Other forms of dyspnea: Secondary | ICD-10-CM | POA: Diagnosis not present

## 2019-12-02 DIAGNOSIS — R69 Illness, unspecified: Secondary | ICD-10-CM | POA: Diagnosis not present

## 2019-12-15 ENCOUNTER — Ambulatory Visit: Payer: Self-pay | Admitting: Cardiology

## 2019-12-15 ENCOUNTER — Encounter: Payer: Self-pay | Admitting: Cardiology

## 2019-12-15 ENCOUNTER — Ambulatory Visit: Payer: Medicare HMO | Admitting: Cardiology

## 2019-12-15 ENCOUNTER — Other Ambulatory Visit: Payer: Self-pay

## 2019-12-15 ENCOUNTER — Encounter: Payer: Self-pay | Admitting: Neurology

## 2019-12-15 VITALS — BP 134/82 | HR 60 | Temp 97.4°F | Ht 70.0 in | Wt 235.0 lb

## 2019-12-15 DIAGNOSIS — E6609 Other obesity due to excess calories: Secondary | ICD-10-CM | POA: Diagnosis not present

## 2019-12-15 DIAGNOSIS — E782 Mixed hyperlipidemia: Secondary | ICD-10-CM | POA: Diagnosis not present

## 2019-12-15 DIAGNOSIS — Z6833 Body mass index (BMI) 33.0-33.9, adult: Secondary | ICD-10-CM | POA: Diagnosis not present

## 2019-12-15 DIAGNOSIS — R0609 Other forms of dyspnea: Secondary | ICD-10-CM | POA: Diagnosis not present

## 2019-12-15 DIAGNOSIS — I10 Essential (primary) hypertension: Secondary | ICD-10-CM | POA: Diagnosis not present

## 2019-12-15 DIAGNOSIS — Z8249 Family history of ischemic heart disease and other diseases of the circulatory system: Secondary | ICD-10-CM

## 2019-12-15 DIAGNOSIS — Z87891 Personal history of nicotine dependence: Secondary | ICD-10-CM | POA: Diagnosis not present

## 2019-12-15 NOTE — Patient Instructions (Signed)
Please remember to bring in your medication bottles in at the next visit.   New Medications that were added at today's visit: None  Change your aspirin to 81mg  po qday from 325mg  po qday.   Medications that were discontinued at today's visit: None  Office will call you to have the following tests scheduled:  Echo  Stress Test  Please get labs done in at your PCP office.   Recommend follow up with your PCP as scheduled.

## 2019-12-15 NOTE — Progress Notes (Signed)
REASON FOR CONSULT: Dyspnea on exertion  Chief Complaint  Patient presents with  . Shortness of Breath  . New Patient (Initial Visit)    REQUESTING PHYSICIAN:  Prince Solian, MD [place],  [place] [phone]  HPI  [name] is a 64 y.o. male who presents to the office with a chief complaint of " shortness of breath."  Patient's past medical history and cardiac risk factors include: hypertension, hyperlipidemia, family history of premature CAD, obesity, former smoker, and advanced age.   Shortness of breath: Patient states that he been having shortness of breath for the last several months.  The symptoms are brought on by effort related activities such as doing yard work, lifting heavy objects, or physical labor at work.  The intensity, or frequency has not worsened over the last several months.  In his symptoms or not associated with chest pain.  Symptoms usually resolve within a minute after resting.  Patient has not taken any medications for his symptoms.  Patient has worked with his primary care physician in regards to medication titrations and laboratory testing but due to the continuing symptoms he was referred to cardiology for further evaluation.  No prior history of echo or nuclear stress test.  Patient states that he has family history of premature coronary artery disease.   Review of systems positive for: shortness of breath with effort related symptoms. Currently patient denies chest pain, lightheadedness, dizziness, palpitations, orthopnea, paroxysmal nocturnal dyspnea, lower extremity swelling, near syncope, syncopal events, hematochezia, hemoptysis, hematemesis, melanotic stools, no symptoms of amaurosis fugax, motor or sensory symptoms or dysphasia in the last 6 months.   Denies prior history of coronary artery disease, myocardial infarction, congestive heart failure, deep venous thrombosis, pulmonary embolism, stroke, transient ischemic  attack.  ALLERGIES: Allergies  Allergen Reactions  . Other Anaphylaxis and Other (See Comments)    Mayonnaise      MEDICATION LIST PRIOR TO VISIT: Current Outpatient Medications on File Prior to Visit  Medication Sig Dispense Refill  . aspirin EC 81 MG tablet Take 81 mg by mouth daily.    . hydrochlorothiazide (HYDRODIURIL) 25 MG tablet Take 25 mg by mouth daily.    Marland Kitchen ibuprofen (ADVIL) 200 MG tablet Take 200 mg by mouth every 6 (six) hours as needed.    . meloxicam (MOBIC) 15 MG tablet Take 15 mg by mouth daily.    . rosuvastatin (CRESTOR) 10 MG tablet Take 10 mg by mouth daily.    Marland Kitchen telmisartan (MICARDIS) 40 MG tablet Take 40 mg by mouth daily.     No current facility-administered medications on file prior to visit.    PAST MEDICAL HISTORY: Past Medical History:  Diagnosis Date  . Arthritis   . Bone infection [facility])    age 87 - ? staph   . BPH (benign prostatic hyperplasia)   . DJD (degenerative joint disease)   . History of kidney stones    as a teenager   . HLD (hyperlipidemia)   . Hypertension     PAST SURGICAL HISTORY: Past Surgical History:  Procedure Laterality Date  . JOINT REPLACEMENT     left hip - 20008   . TONSILLECTOMY    . TOTAL HIP ARTHROPLASTY Right 12/04/2018   Procedure: TOTAL HIP ARTHROPLASTY ANTERIOR APPROACH;  Surgeon: Rod Can, MD;  Location: [facility];  Service: Orthopedics;  Laterality: Right;  . TOTAL KNEE ARTHROPLASTY Right 08/15/2016   Procedure: TOTAL KNEE ARTHROPLASTY;  Surgeon: Latanya Maudlin, MD;  Location: [facility];  Service: Orthopedics;  Laterality: Right;    FAMILY HISTORY: The patient family history includes Heart attack in his brother, father, sister, sister, sister, sister, sister, and sister; Hypertension in his mother; Stroke in his brother, brother, sister, sister, sister, sister, sister, and sister.   SOCIAL HISTORY:  The patient  reports that he quit smoking about 2 years ago. He has a 20.00 pack-year smoking history. He  has never used smokeless tobacco. He reports current alcohol use. He reports that he does not use drugs.  14 ORGAN REVIEW OF SYSTEMS: CONSTITUTIONAL: No fever or significant weight loss EYES: No recent significant visual change EARS, NOSE, MOUTH, THROAT: No recent significant change in hearing CARDIOVASCULAR: See discussion in subjective/HPI RESPIRATORY: See discussion in subjective/HPI GASTROINTESTINAL: No recent complaints of abdominal pain GENITOURINARY: No recent significant change in genitourinary status MUSCULOSKELETAL: No recent significant change in musculoskeletal status INTEGUMENTARY: No recent rash NEUROLOGIC: No recent significant change in motor function PSYCHIATRIC: No recent significant change in mood ENDOCRINOLOGIC: No recent significant change in endocrine status HEMATOLOGIC/LYMPHATIC: No recent significant unexpected bruising ALLERGIC/IMMUNOLOGIC: No recent unexplained allergic reaction  PHYSICAL EXAM: Vitals with BMI 12/16/2019 12/15/2019 12/05/2018  Height '5\' 10"'  '5\' 10"'  -  Weight 234 lbs 235 lbs -  BMI 86.75 44.92 -  Systolic 010 071 219  Diastolic 69 82 61  Pulse 62 60 64   CONSTITUTIONAL: Well-developed and well-nourished. No acute distress.  SKIN: Skin is warm and dry. No rash noted. No cyanosis. No pallor. No jaundice HEAD: Normocephalic and atraumatic.  EYES: No scleral icterus MOUTH/THROAT: Moist oral membranes.  NECK: No JVD present. No thyromegaly noted. No carotid bruits  LYMPHATIC: No visible cervical adenopathy.  CHEST Normal respiratory effort. No intercostal retractions  LUNGS: Clear to auscultation in the upper lung fields.  Decreased breath sounds at the bases.  No stridor. No wheezes. No rales.  CARDIOVASCULAR: Regular rate and rhythm, positive S1-S2, no murmurs rubs or gallops appreciated. ABDOMINAL: Soft, obese, nontender, nondistended, positive bowel sounds in all 4 quadrants, no apparent ascites.  EXTREMITIES: No peripheral edema.  2+  bilateral carotids, radial, popliteals, and posterior tibial pulses.  +1 bilateral dorsalis pedis pulses. HEMATOLOGIC: No significant bruising NEUROLOGIC: Oriented to person, place, and time. Nonfocal. Normal muscle tone.  PSYCHIATRIC: Normal mood and affect. Normal behavior. Cooperative  CARDIAC DATABASE: EKG: 12/15/2019: Sinus bradycardia ventricular rate of 59 bpm.  Echocardiogram: None  Stress Testing:  None  Heart Catheterization: None  LABORATORY DATA: CBC Latest Ref Rng & Units 12/05/2018 11/28/2018 08/17/2016  WBC 4.0 - 10.5 K/uL 13.7(H) 9.0 11.7(H)  Hemoglobin 13.0 - 17.0 g/dL 11.3(L) 14.0 12.2(L)  Hematocrit 39.0 - 52.0 % 34.9(L) 42.3 36.3(L)  Platelets 150 - 400 K/uL 207 264 203    CMP Latest Ref Rng & Units 12/05/2018 11/28/2018 08/17/2016  Glucose 70 - 99 mg/dL 142(H) 101(H) 110(H)  BUN 8 - 23 mg/dL '14 9 11  ' Creatinine 0.61 - 1.24 mg/dL 0.97 0.92 0.92  Sodium 135 - 145 mmol/L 132(L) 132(L) 138  Potassium 3.5 - 5.1 mmol/L 4.4 4.3 4.0  Chloride 98 - 111 mmol/L 104 100 105  CO2 22 - 32 mmol/L '23 25 28  ' Calcium 8.9 - 10.3 mg/dL 8.7(L) 9.5 8.9  Total Protein 6.5 - 8.1 g/dL - - -  Total Bilirubin 0.3 - 1.2 mg/dL - - -  Alkaline Phos 38 - 126 U/L - - -  AST 15 - 41 U/L - - -  ALT 17 - 63 U/L - - -  External labs: 12/01/2019 BUN 12, creatinine 1.1 mg/dL, EGFR 67  Hemoglobin A1c 5.6   FINAL MEDICATION LIST END OF ENCOUNTER: No orders of the defined types were placed in this encounter.  Medications Discontinued During This Encounter  Medication Reason  . aspirin 81 MG chewable tablet Error  . docusate sodium (COLACE) 100 MG capsule Error  . methocarbamol (ROBAXIN) 500 MG tablet Error  . ondansetron (ZOFRAN) 4 MG tablet Error  . senna (SENOKOT) 8.6 MG TABS tablet Error  . aspirin 325 MG tablet Dose change     Current Outpatient Medications:  .  aspirin EC 81 MG tablet, Take 81 mg by mouth daily., Disp: , Rfl:  .  hydrochlorothiazide (HYDRODIURIL) 25 MG tablet,  Take 25 mg by mouth daily., Disp: , Rfl:  .  ibuprofen (ADVIL) 200 MG tablet, Take 200 mg by mouth every 6 (six) hours as needed., Disp: , Rfl:  .  meloxicam (MOBIC) 15 MG tablet, Take 15 mg by mouth daily., Disp: , Rfl:  .  rosuvastatin (CRESTOR) 10 MG tablet, Take 10 mg by mouth daily., Disp: , Rfl:  .  telmisartan (MICARDIS) 40 MG tablet, Take 40 mg by mouth daily., Disp: , Rfl:  .  Ascorbic Acid (VITAMIN C) 1000 MG tablet, Take 1,000 mg by mouth daily., Disp: , Rfl:  .  zolpidem (AMBIEN) 5 MG tablet, Take 1 tablet (5 mg total) by mouth at bedtime as needed for sleep (three times a week.)., Disp: 30 tablet, Rfl: 1  IMPRESSION:    ICD-10-CM   1. Dyspnea on exertion  R06.00 EKG 12-Lead    PCV ECHOCARDIOGRAM COMPLETE    PCV MYOCARDIAL PERFUSION WITH LEXISCAN    Pro b natriuretic peptide (BNP)[facility] [facility])    CANCELED: Pro b natriuretic peptide (BNP)[facility] [facility])  2. Benign hypertension  I10   3. Mixed hyperlipidemia  E78.2   4. Family history of premature CAD  Z82.49 PCV ECHOCARDIOGRAM COMPLETE    PCV MYOCARDIAL PERFUSION WITH LEXISCAN  5. Former smoker  Z87.891   66. Class 1 obesity due to excess calories with serious comorbidity and body mass index (BMI) of 33.0 to 33.9 in adult  E66.09    Z68.33      RECOMMENDATIONS: Dyspnea on exertion:  Patient referred to the office for further evaluation of effort related dyspnea as she has other cardiovascular risk factors for CAD.  EKG performed, interpreted, and findings noted above.  No evidence of underlying injury pattern.  Plan echocardiogram to evaluate for structural heart disease and LV function.  Plan nuclear stress test to rule out underlying reversible ischemia.  Most recent blood work reviewed.  Check BNP for baseline.  Order provided, patient would like to have this done at his primary care office.  Would recommend pulmonary evaluation given his history of smoking and he complains  of excessive snoring.  Benign essential hypertension: Currently stable.  Continue antihypertensive medications.  Managed by primary team.  Low-salt diet recommended.  Mixed hyperlipidemia: Continue statin therapy.  Managed by primary team.  Family history of premature coronary artery disease:  Patient currently on aspirin 325 mg p.o. daily.  Recommended him to decrease this to 81 mg p.o. daily.  Continue risk factor modifications.  Plan echo and nuclear stress test as noted above.  Former smoker: Educated on importance of continued smoking cessation.  Class I obesity, due to excess calories Body mass index is 33.72 kg/m.  Educated on importance of lifestyle modifications to improve modifiable cardiac  risk factors.   Orders Placed This Encounter  Procedures  . Pro b natriuretic peptide (BNP)[facility] [facility])  . PCV MYOCARDIAL PERFUSION WITH LEXISCAN  . EKG 12-Lead  . PCV ECHOCARDIOGRAM COMPLETE   --Continue cardiac medications as reconciled in final medication list. --Return in about 4 weeks (around 01/12/2020) for Discussion of test results.. Or sooner if needed. --Continue follow-up with your primary care physician regarding the management of your other chronic comorbid conditions.  Patient's questions and concerns were addressed to his satisfaction. He voices understanding of the instructions provided during this encounter.   This note was created using a voice recognition software as a result there may be grammatical errors inadvertently enclosed that do not reflect the nature of this encounter. Every attempt is made to correct such errors.  Rex Kras, DO, [facility]. [place] Office: [phone]

## 2019-12-16 ENCOUNTER — Ambulatory Visit: Payer: Medicare HMO | Admitting: Neurology

## 2019-12-16 ENCOUNTER — Encounter: Payer: Self-pay | Admitting: Neurology

## 2019-12-16 ENCOUNTER — Encounter: Payer: Self-pay | Admitting: Cardiology

## 2019-12-16 VITALS — BP 125/69 | HR 62 | Temp 97.9°F | Ht 70.0 in | Wt 234.0 lb

## 2019-12-16 DIAGNOSIS — R0600 Dyspnea, unspecified: Secondary | ICD-10-CM | POA: Diagnosis not present

## 2019-12-16 DIAGNOSIS — Z72820 Sleep deprivation: Secondary | ICD-10-CM | POA: Insufficient documentation

## 2019-12-16 DIAGNOSIS — R0683 Snoring: Secondary | ICD-10-CM

## 2019-12-16 DIAGNOSIS — R351 Nocturia: Secondary | ICD-10-CM | POA: Diagnosis not present

## 2019-12-16 MED ORDER — ZOLPIDEM TARTRATE 5 MG PO TABS: 5.0000 mg | ORAL_TABLET | Freq: Every evening | ORAL | 1 refills | Status: DC | PRN

## 2019-12-16 NOTE — Progress Notes (Signed)
SLEEP MEDICINE CLINIC    Provider:  Larey Seat, MD  Primary Care Physician:  Prince Solian, [place] [address]     Referring Provider: Prince Solian, [place],  [place] [address]          Chief Complaint according to patient   Patient presents with:    . New Patient (Initial Visit)     Dr Dagmar Hait referral for sleepiness and fragmented night time sleep- snoring.       HISTORY OF PRESENT ILLNESS:  [name] is a 64 year old Caucasian male patient seen upon  referral on 12/16/2019 .  Chief concern according to patient :  " not sleeping through the night, having to go to the bathroom 2-4 times "    I have the pleasure of seeing [name] today, a right -handed White or Caucasian male with a possible sleep disorder.  he   has a past medical history of Arthritis, Bone infection ([place]), BPH (benign prostatic hyperplasia), DJD (degenerative joint disease), History of kidney stones, HLD (hyperlipidemia), and Hypertension.Marland Kitchen He reports nocturia and drinks a lot of tea and sodas.    Sleep relevant medical history: Nocturia up to 4 , vivid dreams, Tonsillectomy in childhood,  cervical spine surgery fusion, anterior.  Family medical /sleep history: sister on oxygen 24/ 7 but not any other family member on CPAP with OSA.    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 Family status is single  with an adult son and daughter. The patient currently works 7-10 hours every other day.  Pets are not  present.Tobacco use- quit 2018. ETOH use :  4 a week.. Caffeine intake in form of Coffee( 2/ week ) Soda( coke- 1.5 large bottles  ) Tea ( 2-3 liters a day ) , nor energy drinks. Regular exercise : none  Hobbies : my job- outside of church related activities.    Sleep habits are as follows: The patient's dinner time is between 6-9  PM- very variable . The patient  goes to bed at 9-12 PM and often has fallen asleep in front of the TV- he continues to sleep for 1-2 hours, TV on a timer, wakes for 4 bathroom breaks." I start thinking of things " .   The preferred sleep position is  sideways  with the support of 1 pillow, in an adjustable bed . Dreams are reportedly  frequent/vivid. He sleeps on averages 3-6 hours.  6-7 AM is the usual rise time. The patient wakes up spontaneously with an alarm set at 6AM.  He reports not feeling refreshed or restored in AM, with symptoms such as rhinitis.  Naps are taken infrequently, unscheduled in front of the TV- lasting from 30- 45  minutes and are less refreshing than nocturnal sleep.    Review of Systems: Out of a complete 14 system review, the patient complains of only the following symptoms, and all other reviewed systems are negative.:  Fatigue, sleepiness , snoring, fragmented sleep, Insomnia NOCTURIA< caffeine overuse. Had kidney stones.    How likely are you to doze in the following situations: 0 = not likely, 1 = slight chance, 2 = moderate chance, 3 = high chance   Sitting and Reading?1Watching Television?1 Sitting inactive in a public place (theater or meeting)? As a passenger in a car for an hour without a break? Lying down in  the afternoon when circumstances permit? Sitting and talking to someone? Sitting quietly after lunch without alcohol? In a car, while stopped for a few minutes in traffic?   Total =2/ 24 points   FSS endorsed at 11/ 63 points.  GDS N/A   Social History   Socioeconomic History  . Marital status: Widowed    Spouse name: Not on file  . Number of children: Not on file  . Years of education: Not on file  . Highest education level: Not on file  Occupational History  . Not on file  Tobacco Use  . Smoking status: Former Smoker    Packs/day: 0.50    Years: 40.00    Pack years: 20.00    Quit date: 09/02/2017    Years since quitting: 2.2  . Smokeless tobacco: Never Used    Substance and Sexual Activity  . Alcohol use: Yes    Comment: glass of wine on weekends   . Drug use: No  . Sexual activity: Not on file  Other Topics Concern  . Not on file  Social History Narrative  . Not on file   Social Determinants of Health   Financial Resource Strain:   . Difficulty of Paying Living Expenses: Not on file  Food Insecurity:   . Worried About Charity fundraiser in the Last Year: Not on file  . Ran Out of Food in the Last Year: Not on file  Transportation Needs:   . Lack of Transportation (Medical): Not on file  . Lack of Transportation (Non-Medical): Not on file  Physical Activity:   . Days of Exercise per Week: Not on file  . Minutes of Exercise per Session: Not on file  Stress:   . Feeling of Stress : Not on file  Social Connections:   . Frequency of Communication with Friends and Family: Not on file  . Frequency of Social Gatherings with Friends and Family: Not on file  . Attends Religious Services: Not on file  . Active Member of Clubs or Organizations: Not on file  . Attends Archivist Meetings: Not on file  . Marital Status: Not on file    Family History  Problem Relation Age of Onset  . Hypertension Mother   . Heart attack Father   . Heart attack Brother   . Stroke Brother   . Heart attack Sister   . Stroke Sister   . Heart attack Sister   . Stroke Sister   . Heart attack Sister   . Stroke Sister   . Heart attack Sister   . Stroke Sister   . Heart attack Sister   . Stroke Sister   . Heart attack Sister   . Stroke Sister   . Stroke Brother    7 sisters with stroke history , one is oxygen dependent. 6 passed away -  2 brothers with the same health problem, one passed away.  Past Medical History:  Diagnosis Date  . Arthritis   . Bone infection [facility])    age 66 - ? staph   . BPH (benign prostatic hyperplasia)   . DJD (degenerative joint disease)   . History of kidney stones    as a teenager   . HLD (hyperlipidemia)    . Hypertension     Past Surgical History:  Procedure Laterality Date  . JOINT REPLACEMENT     left hip - 20008   . TONSILLECTOMY    . TOTAL HIP ARTHROPLASTY Right 12/04/2018  Procedure: TOTAL HIP ARTHROPLASTY ANTERIOR APPROACH;  Surgeon: Rod Can, MD;  Location: [facility];  Service: Orthopedics;  Laterality: Right;  . TOTAL KNEE ARTHROPLASTY Right 08/15/2016   Procedure: TOTAL KNEE ARTHROPLASTY;  Surgeon: Latanya Maudlin, MD;  Location: [facility];  Service: Orthopedics;  Laterality: Right;     Current Outpatient Medications on File Prior to Visit  Medication Sig Dispense Refill  . Ascorbic Acid (VITAMIN C) 1000 MG tablet Take 1,000 mg by mouth daily.    Marland Kitchen aspirin 325 MG tablet Take 325 mg by mouth daily.    . hydrochlorothiazide (HYDRODIURIL) 25 MG tablet Take 25 mg by mouth daily.    Marland Kitchen ibuprofen (ADVIL) 200 MG tablet Take 200 mg by mouth every 6 (six) hours as needed.    . meloxicam (MOBIC) 15 MG tablet Take 15 mg by mouth daily.    . rosuvastatin (CRESTOR) 10 MG tablet Take 10 mg by mouth daily.    Marland Kitchen telmisartan (MICARDIS) 40 MG tablet Take 40 mg by mouth daily.     No current facility-administered medications on file prior to visit.    Allergies  Allergen Reactions  . Other Anaphylaxis and Other (See Comments)    Mayonnaise     Physical exam:  Today's Vitals   12/16/19 0939  BP: 125/69  Pulse: 62  Temp: 97.9 F (36.6 C)  Weight: 234 lb (106.1 kg)  Height: 5\' 10"  (1.778 m)   Body mass index is 33.58 kg/m.   Wt Readings from Last 3 Encounters:  12/16/19 234 lb (106.1 kg)  12/15/19 235 lb (106.6 kg)  12/04/18 220 lb 6 oz (100 kg)     Ht Readings from Last 3 Encounters:  12/16/19 5\' 10"  (1.778 m)  12/15/19 5\' 10"  (1.778 m)  12/04/18 5\' 9"  (1.753 m)      General: The patient is awake, alert and appears not in acute distress. The patient is well groomed. Head: Normocephalic, atraumatic.  Neck is supple. Mallampati grade 2,   neck circumference:18 inches .   Nasal airflow is patent.  Retrognathia is not seen .  Dental status: biological teeth  Cardiovascular:  Regular rate and cardiac rhythm by pulse,  without distended neck veins. Respiratory: Lungs are clear to auscultation.  Skin:  Without evidence of ankle edema, or rash. Trunk: The patient's posture is erect.   Neurologic exam : The patient is awake and alert, oriented to place and time.   Memory subjective described as intact.  Attention span & concentration ability appears normal.  Speech is fluent,  without  dysarthria, dysphonia or aphasia.  Mood and affect are appropriate.   Cranial nerves: no loss of smell or taste reported  Pupils are equal and briskly reactive to light. Funduscopic exam deferred. .  Extraocular movements in vertical and horizontal planes were intact and without nystagmus. No Diplopia. Visual fields by finger perimetry are intact. Hearing was intact to soft voice and finger rubbing.   Facial sensation intact to fine touch. Facial motor strength is symmetric and tongue and uvula move midline.  Neck ROM : rotation, tilt and flexion extension were normal for age and status post anterior fusion, his shoulder shrug was symmetrical.   Motor exam:  Symmetric bulk, tone and ROM.   Normal tone without cog wheeling, symmetric grip strength .  Sensory:  Fine touch, pinprick and vibration were tested  and  normal.  Proprioception tested in the upper extremities was normal. Coordination: Rapid alternating movements in the fingers/hands were of normal speed.  The Finger-to-nose maneuver was intact without evidence of ataxia, dysmetria or tremor. Gait and station: Patient could rise unassisted from a seated position, walked without assistive device.  Stance is of normal width/ base and the patient turned with 3 steps.  Toe and heel walk were deferred.  Deep tendon reflexes: in the upper and lower extremities are symmetric and brisk.  Babinski response was deferred.    We  discussed the most obvious hindrances to good , sustained sleep- his caffeine and fluid intake throughout th day , likely causing his nocturia. He reported also his failure to respond to melatonin, Unisom or sleep melts OTC.  He was prescribed trazodone- failed.   He believes his prostate is not hypertrophied and a medication to treat BPH was not effective.   He advised me not to want to take medication.   Mr. Biter has tried over-the-counter medications basically melatonin sleep melts and he has tried a prescription drug to help with benign prostate hyperplasia but none of these have helped him to fall asleep and stay asleep.  This problem is not usually to fall asleep but to stay asleep and his sleep has been interrupted up up to 4 times each night by nocturia.  He feels that he empties his bladder pretty completely.  He does have excessive caffeine intake at this time due to the amount of tea that he drinks and he used to drink sodas probably was more caffeine than he takes now.  He has 30-year smoking history but quit and he has remained tobacco free for about 2-1/2 years now.  He reports no history of COPD for him there is a family history strong family history for coronary artery disease heart disease and premature death with strokes.  He has been known to snore but nobody has witnessed if he stops breathing at night.  He is not excessively daytime sleepy and he denies fatigue.  There are some days when he finds himself well rested in the morning and other days when not at his overall sleep time is significantly reduced.  I reviewed his laboratories that show a fasting glucose of 160 mg/dL also this was drawn on 11:30 AM and may have been after breakfast.  Sodium was 134 slightly on the low side MCV was elevated 100.5, RDW L low 10.8 HbA1c was 5.6, TSH normal at 0.79 international units, free thyroid hormone for 1.1 ng/mL in normal range.  EKG was in normal range and regular sinus rhythm prevailed.  He  had a chest x-ray especially to look at the heart silhouette it showed no evidence of infiltrate or edema mass or cardiac enlargement. He has a stress test scheduled- Dr. Filbert Schilder .   After spending a total time of 40  minutes face to face and additional time for physical and neurologic examination, review of laboratory studies,  personal review of imaging studies, reports and results of other testing and review of referral information / records as far as provided in visit, I have established the following assessments:  1)  Sleep hygiene improvement- set times to go to bed, rise from bed. 2)  Cut down on fluid intake after 5 Pm and caffeine intake after noon.  3)  Improve diet, more fresh fruits and veggies, consider a multivitamin .    My Plan is to proceed with:  1)  HST to screen for OSA. 2)  Ambien 5 mg prn not to exceed 3 a week.    I would like to thank  Prince Solian, MD and Prince Solian, [place],  [place] [address] for allowing me to meet with and to take care of this pleasant patient.   In short, [name].  I plan to follow up either personally or through our NP within  month.    Electronically signed by: Larey Seat, MD 12/16/2019 9:54 AM  [facility] Board certified by The [employer] of Sleep Medicine and Diplomate of the [employer] of Sleep Medicine. Board certified In Neurology through the [place], Fellow of the [employer] of Neurology. Medical Director of [facility].

## 2019-12-16 NOTE — Patient Instructions (Signed)

## 2019-12-22 DIAGNOSIS — R109 Unspecified abdominal pain: Secondary | ICD-10-CM | POA: Diagnosis not present

## 2019-12-28 NOTE — Progress Notes (Signed)
Bnp within normal limits.

## 2019-12-29 DIAGNOSIS — R69 Illness, unspecified: Secondary | ICD-10-CM | POA: Diagnosis not present

## 2019-12-30 ENCOUNTER — Ambulatory Visit: Payer: Medicare HMO

## 2019-12-30 ENCOUNTER — Other Ambulatory Visit: Payer: Self-pay

## 2019-12-30 DIAGNOSIS — Z8249 Family history of ischemic heart disease and other diseases of the circulatory system: Secondary | ICD-10-CM | POA: Diagnosis not present

## 2019-12-30 DIAGNOSIS — R0600 Dyspnea, unspecified: Secondary | ICD-10-CM | POA: Diagnosis not present

## 2019-12-30 DIAGNOSIS — R0609 Other forms of dyspnea: Secondary | ICD-10-CM

## 2019-12-31 ENCOUNTER — Other Ambulatory Visit: Payer: Self-pay | Admitting: Internal Medicine

## 2019-12-31 DIAGNOSIS — R109 Unspecified abdominal pain: Secondary | ICD-10-CM

## 2019-12-31 DIAGNOSIS — I10 Essential (primary) hypertension: Secondary | ICD-10-CM

## 2020-01-06 ENCOUNTER — Ambulatory Visit (INDEPENDENT_AMBULATORY_CARE_PROVIDER_SITE_OTHER): Payer: Medicare HMO | Admitting: Neurology

## 2020-01-06 ENCOUNTER — Telehealth: Payer: Self-pay

## 2020-01-06 DIAGNOSIS — R0683 Snoring: Secondary | ICD-10-CM

## 2020-01-06 DIAGNOSIS — G4733 Obstructive sleep apnea (adult) (pediatric): Secondary | ICD-10-CM

## 2020-01-06 DIAGNOSIS — R351 Nocturia: Secondary | ICD-10-CM

## 2020-01-06 DIAGNOSIS — Z72820 Sleep deprivation: Secondary | ICD-10-CM

## 2020-01-06 NOTE — Telephone Encounter (Signed)
-----   Message from [place], [place] sent at 01/01/2020 12:12 PM EST ----- The nuclear stress test that was recently performed was essentially reported as being low risk.    There is no evidence of reversibility.    The other details of the report will be discussed at the next office visit.  If you continue to have symptoms that have increased in intensity, frequency, duration or new symptoms suggestive of typical chest pain as discussed during last office visit please still seek medical attention at the closest ER via EMS.

## 2020-01-06 NOTE — Telephone Encounter (Signed)
Spoke with patient about nuclear stress test. Patient voiced understanding.

## 2020-01-13 ENCOUNTER — Ambulatory Visit: Payer: Medicare HMO | Admitting: Cardiology

## 2020-01-13 DIAGNOSIS — R69 Illness, unspecified: Secondary | ICD-10-CM | POA: Diagnosis not present

## 2020-01-14 NOTE — Progress Notes (Signed)
Summary & Diagnosis:   HST documented 7 hours of sleep and mild OSA at AHI of 8.5/h. No  REM sleep accentuation noted. Strong supine sleep dependence was  seen at AHI 12.1/h. No hypoxemia, no tachycardia but bradycardia  is noted. Snoring was only documented for 12 minutes.   Recommendations:     This mild, uncomplicated obstructive sleep apnea can be treated  by a dental device or by sleeping in non-supine position. CPAP  intervention is not needed for such mild apnea.  Interpreting Physician: Larey Seat, MD

## 2020-01-14 NOTE — Procedures (Signed)
  Patient Information     First Name: Cameron Last Name: Watson ID: [id number]  Birth Date: 11/26/1955 Age: 64 Gender: Male  Referring Provider: Prince Solian, MD BMI: 33.5 (W=233 lbs, H=5' 10'')  Neck Circ.:  18 '' Epworth:  2/24   Sleep Study Information    Study Date: Jan 06, 2020 S/H/A Version: 001.001.001.001 / 4.1.1528 / 34  History:    [name] is a 64 year old Caucasian male patient seen on 12/16/2019. Chief concern according to patient:  " not sleeping through the night, having to go to the bathroom 2-4 times " Sigifredo L Lardner has a past medical history of Arthritis, Bone infection ([place]), BPH (benign prostatic hyperplasia), DJD (degenerative joint disease), History of kidney stones, HLD (hyperlipidemia), and Hypertension. He reports nocturia but also drinks a lot of tea and sodas.      Summary & Diagnosis:    HST documented 7 hours of sleep and mild OSA at AHI of 8.5/h. No REM sleep accentuation noted. Strong supine sleep dependence was seen at AHI 12.1/h. No hypoxemia, no tachycardia but bradycardia is noted. Snoring was only documented for 12 minutes.   Recommendations:      This mild, uncomplicated obstructive sleep apnea can be treated by a dental device or by sleeping in non-supine position. CPAP intervention is not needed for such mild apnea.  Interpreting Physician: Larey Seat, MD             Sleep Summary  Oxygen Saturation Statistics   Start Study Time: End Study Time: Total Recording Time:  9:37:05 PM 6:34:30 AM 8 h, 57 min  Total Sleep Time % REM of Sleep Time:  7 h, 4 min  10.0    Mean: 95 Minimum: 81 Maximum: 99  Mean of Desaturations Nadirs (%):   92  Oxygen Desaturation. %:   4-9 10-20 >20 Total  Events Number Total    26  0 96.3 0.0  1 3.7  27 100.0  Oxygen Saturation: <90 <=88 <85 <80 <70  Duration (minutes): Sleep % 0.0 0.0  0.0 0.0  0.0 0.0 0.0 0.0 0.0 0.0     Respiratory Indices      Total Events REM NREM All  Night  pRDI:  85  pAHI:  59 ODI:  27  pAHIc:  20  %  CSR: 0.0 8.9 3.0 3.0 3.0 12.6 9.1 4.0 2.9 12.2 8.5 3.9 2.9       Pulse Rate Statistics during Sleep (BPM)      Mean: 64 Minimum: 48 Maximum: 85    Indices are calculated using technically valid sleep time of 6 h, 57 min. Central-Indices are calculated using technically valid sleep time of 6 h, 54 min. pRDI/pAHI are calculated using 02 desaturations ? 3%  Body Position Statistics  Position Supine Prone Right Left Non-Supine  Sleep (min) 243.0 0.0 27.9 153.0 180.9  Sleep % 57.2 0.0 6.6 36.0 42.6  pRDI 15.2 N/A 8.6 8.3 8.3  pAHI 11.4 N/A 6.5 4.3 4.7  ODI 4.6 N/A 4.3 2.8 3.0     Snoring Statistics Snoring Level (dB) >40 >50 >60 >70 >80 >Threshold (45)  Sleep (min) 47.5 7.6 3.0 0.0 0.0 12.2  Sleep % 11.2 1.8 0.7 0.0 0.0 2.9    Mean: 41 dB Sleep Stages Chart             pAHI=8.5

## 2020-01-18 ENCOUNTER — Telehealth: Payer: Self-pay | Admitting: Neurology

## 2020-01-18 NOTE — Telephone Encounter (Signed)
Called the patient the sleep study findings. Advised that with the minimal amount of sleep apnea that was present she didn't see any reason to initiate CPAP therapy. His oxygen and heart rate did well. Pt inquired about trying another medication to help him sleep. Advised that from what I could tell it looked like he had a total sleep time of about 7 hrs on the HST. Patient states that he didn't take the medication that night. He states that he wakes up frequently during the night to void. Informed that Dr Brett Fairy was on vacation but certainly when she returns I can ask if she is willing to assess the patient's medication and see if she would like to try something different. I offered multiple times to schedule a follow up visit and he wanted to wait until she returns from vacation to ask. Pt verbalized understanding.

## 2020-01-18 NOTE — Telephone Encounter (Signed)
-----   Message from Larey Seat, MD sent at 01/14/2020  5:59 PM EDT ----- Summary & Diagnosis:   HST documented 7 hours of sleep and mild OSA at AHI of 8.5/h. No  REM sleep accentuation noted. Strong supine sleep dependence was  seen at AHI 12.1/h. No hypoxemia, no tachycardia but bradycardia  is noted. Snoring was only documented for 12 minutes.   Recommendations:     This mild, uncomplicated obstructive sleep apnea can be treated  by a dental device or by sleeping in non-supine position. CPAP  intervention is not needed for such mild apnea.  Interpreting Physician: Larey Seat, MD

## 2020-01-19 ENCOUNTER — Other Ambulatory Visit: Payer: Self-pay

## 2020-01-19 ENCOUNTER — Ambulatory Visit: Payer: Medicare HMO | Admitting: Cardiology

## 2020-01-19 ENCOUNTER — Encounter: Payer: Self-pay | Admitting: Cardiology

## 2020-01-19 VITALS — BP 117/63 | HR 66 | Temp 97.3°F | Resp 16 | Ht 70.0 in | Wt 226.5 lb

## 2020-01-19 DIAGNOSIS — Z87891 Personal history of nicotine dependence: Secondary | ICD-10-CM | POA: Diagnosis not present

## 2020-01-19 DIAGNOSIS — Z712 Person consulting for explanation of examination or test findings: Secondary | ICD-10-CM | POA: Diagnosis not present

## 2020-01-19 DIAGNOSIS — Z6832 Body mass index (BMI) 32.0-32.9, adult: Secondary | ICD-10-CM | POA: Diagnosis not present

## 2020-01-19 DIAGNOSIS — E782 Mixed hyperlipidemia: Secondary | ICD-10-CM

## 2020-01-19 DIAGNOSIS — R0609 Other forms of dyspnea: Secondary | ICD-10-CM

## 2020-01-19 DIAGNOSIS — E6609 Other obesity due to excess calories: Secondary | ICD-10-CM | POA: Diagnosis not present

## 2020-01-19 DIAGNOSIS — Z8249 Family history of ischemic heart disease and other diseases of the circulatory system: Secondary | ICD-10-CM | POA: Diagnosis not present

## 2020-01-19 DIAGNOSIS — I10 Essential (primary) hypertension: Secondary | ICD-10-CM | POA: Diagnosis not present

## 2020-01-19 NOTE — Progress Notes (Signed)
Chief Complaint  Patient presents with  . Dyspnea on exertion    4 week follow up  . Results   HPI  [name] is a 65 y.o. male who presents to the office with a chief complaint of " shortness of breath and review test results."  Patient's past medical history and cardiac risk factors include: hypertension, hyperlipidemia, family history of premature CAD, obesity, former smoker, and advanced age.   Shortness of breath: Patient was seen in consultation in February 2021 at the request of his primary care physician for evaluation of effort related dyspnea given his cardiovascular risk factors.  Patient was having effort related dyspnea with doing work around the yard, lifting heavy objects and physical labor at work.  Symptoms usually resolve within a minute and no improving or worsening factors.  Patient was recommended to undergo an echocardiogram and nuclear stress test for further risk ratification.  Since last office visit patient has lost more than 5 pounds and he states that his shortness of breath is improving gradually.  He does not have any chest pain at rest or with effort related activities.  His echocardiogram report preserved left ventricular systolic function without any significant valvular heart disease.  And his nuclear stress test was reported to be low risk in general.  The results as well as the images were reviewed with the patient in great detail at today's office visit.    Patient states that he has family history of premature coronary artery disease.   Review of systems positive for: shortness of breath with effort related symptoms (improving). Currently patient denies chest pain, lightheadedness, dizziness, palpitations, orthopnea, paroxysmal nocturnal dyspnea, lower extremity swelling, near syncope, syncopal events, hematochezia, hemoptysis, hematemesis, melanotic stools, no symptoms of amaurosis fugax, motor or sensory symptoms or dysphasia in the last 6 months.    Denies prior history of coronary artery disease, myocardial infarction, congestive heart failure, deep venous thrombosis, pulmonary embolism, stroke, transient ischemic attack.  ALLERGIES: Allergies  Allergen Reactions  . Other Anaphylaxis and Other (See Comments)    Mayonnaise    MEDICATION LIST PRIOR TO VISIT: Current Outpatient Medications on File Prior to Visit  Medication Sig Dispense Refill  . Ascorbic Acid (VITAMIN C) 1000 MG tablet Take 1,000 mg by mouth daily.    Marland Kitchen aspirin EC 81 MG tablet Take 81 mg by mouth daily.    . hydrochlorothiazide (HYDRODIURIL) 25 MG tablet Take 25 mg by mouth daily.    Marland Kitchen ibuprofen (ADVIL) 200 MG tablet Take 200 mg by mouth every 6 (six) hours as needed.    . meloxicam (MOBIC) 15 MG tablet Take 15 mg by mouth daily.    . rosuvastatin (CRESTOR) 10 MG tablet Take 10 mg by mouth daily.    Marland Kitchen telmisartan (MICARDIS) 40 MG tablet Take 40 mg by mouth daily.     No current facility-administered medications on file prior to visit.    PAST MEDICAL HISTORY: Past Medical History:  Diagnosis Date  . Arthritis   . Bone infection [facility])    age 26 - ? staph   . BPH (benign prostatic hyperplasia)   . DJD (degenerative joint disease)   . History of kidney stones    as a teenager   . HLD (hyperlipidemia)   . Hypertension     PAST SURGICAL HISTORY: Past Surgical History:  Procedure Laterality Date  . JOINT REPLACEMENT     left hip - 20008   . TONSILLECTOMY    . TOTAL  HIP ARTHROPLASTY Right 12/04/2018   Procedure: TOTAL HIP ARTHROPLASTY ANTERIOR APPROACH;  Surgeon: Rod Can, MD;  Location: [facility];  Service: Orthopedics;  Laterality: Right;  . TOTAL KNEE ARTHROPLASTY Right 08/15/2016   Procedure: TOTAL KNEE ARTHROPLASTY;  Surgeon: Latanya Maudlin, MD;  Location: [facility];  Service: Orthopedics;  Laterality: Right;    FAMILY HISTORY: The patient family history includes Heart attack in his brother, father, sister, sister, sister, sister, sister, and  sister; Hypertension in his mother; Stroke in his brother, brother, sister, sister, sister, sister, sister, and sister.   SOCIAL HISTORY:  The patient  reports that he quit smoking about 2 years ago. He has a 20.00 pack-year smoking history. He has never used smokeless tobacco. He reports current alcohol use. He reports that he does not use drugs.  14 ORGAN REVIEW OF SYSTEMS: CONSTITUTIONAL: No fever or significant weight loss EYES: No recent significant visual change EARS, NOSE, MOUTH, THROAT: No recent significant change in hearing CARDIOVASCULAR: See discussion in subjective/HPI RESPIRATORY: See discussion in subjective/HPI GASTROINTESTINAL: No recent complaints of abdominal pain GENITOURINARY: No recent significant change in genitourinary status MUSCULOSKELETAL: No recent significant change in musculoskeletal status INTEGUMENTARY: No recent rash NEUROLOGIC: No recent significant change in motor function PSYCHIATRIC: No recent significant change in mood ENDOCRINOLOGIC: No recent significant change in endocrine status HEMATOLOGIC/LYMPHATIC: No recent significant unexpected bruising ALLERGIC/IMMUNOLOGIC: No recent unexplained allergic reaction  PHYSICAL EXAM: Vitals with BMI 01/19/2020 12/16/2019 12/15/2019  Height '5\' 10"'  '5\' 10"'  '5\' 10"'   Weight 226 lbs 8 oz 234 lbs 235 lbs  BMI 32.5 37.90 24.09  Systolic 735 329 924  Diastolic 63 69 82  Pulse 66 62 60   CONSTITUTIONAL: Well-developed and well-nourished. No acute distress.  SKIN: Skin is warm and dry. No rash noted. No cyanosis. No pallor. No jaundice HEAD: Normocephalic and atraumatic.  EYES: No scleral icterus MOUTH/THROAT: Moist oral membranes.  NECK: No JVD present. No thyromegaly noted. No carotid bruits  LYMPHATIC: No visible cervical adenopathy.  CHEST Normal respiratory effort. No intercostal retractions  LUNGS: Clear to auscultation in the upper lung fields.  Decreased breath sounds at the bases.  No stridor. No wheezes. No  rales.  CARDIOVASCULAR: Regular rate and rhythm, positive S1-S2, no murmurs rubs or gallops appreciated. ABDOMINAL: Soft, obese, nontender, nondistended, positive bowel sounds in all 4 quadrants, no apparent ascites.  EXTREMITIES: No peripheral edema.  2+ bilateral carotids, radial, popliteals, and posterior tibial pulses.  +1 bilateral dorsalis pedis pulses. HEMATOLOGIC: No significant bruising NEUROLOGIC: Oriented to person, place, and time. Nonfocal. Normal muscle tone.  PSYCHIATRIC: Normal mood and affect. Normal behavior. Cooperative  CARDIAC DATABASE: EKG: 12/15/2019: Sinus bradycardia ventricular rate of 59 bpm.  Echocardiogram: 12/30/2019:  Normal LV systolic function with visual EF 55-60%. Left ventricle cavity is normal in size. Normal global wall motion. Normal diastolic filling pattern, normal LAP. Moderate left ventricular hypertrophy. No obvious regional wall motion abnormalities. Calculated EF 55%.  Mild (Grade I) mitral regurgitation. No other significant valvular abnormalities.   Stress Testing:  Lexiscan Tetrofosmin Stress Test  12/30/2019: Nondiagnostic ECG stress. Prominent diaphragmatic attenuation noted in the inferior wall. There is no reversible defect noted.  Stress LV EF: 69% without wall motion abnormality.  No previous exam available for comparison. Low risk study.  Heart Catheterization: None  LABORATORY DATA: CBC Latest Ref Rng & Units 12/05/2018 11/28/2018 08/17/2016  WBC 4.0 - 10.5 K/uL 13.7(H) 9.0 11.7(H)  Hemoglobin 13.0 - 17.0 g/dL 11.3(L) 14.0 12.2(L)  Hematocrit 39.0 - 52.0 %  34.9(L) 42.3 36.3(L)  Platelets 150 - 400 K/uL 207 264 203    CMP Latest Ref Rng & Units 12/05/2018 11/28/2018 08/17/2016  Glucose 70 - 99 mg/dL 142(H) 101(H) 110(H)  BUN 8 - 23 mg/dL '14 9 11  ' Creatinine 0.61 - 1.24 mg/dL 0.97 0.92 0.92  Sodium 135 - 145 mmol/L 132(L) 132(L) 138  Potassium 3.5 - 5.1 mmol/L 4.4 4.3 4.0  Chloride 98 - 111 mmol/L 104 100 105  CO2 22 - 32  mmol/L '23 25 28  ' Calcium 8.9 - 10.3 mg/dL 8.7(L) 9.5 8.9  Total Protein 6.5 - 8.1 g/dL - - -  Total Bilirubin 0.3 - 1.2 mg/dL - - -  Alkaline Phos 38 - 126 U/L - - -  AST 15 - 41 U/L - - -  ALT 17 - 63 U/L - - -   External labs: 12/01/2019 BUN 12, creatinine 1.1 mg/dL, EGFR 67  Hemoglobin A1c 5.6   FINAL MEDICATION LIST END OF ENCOUNTER: No orders of the defined types were placed in this encounter.  Medications Discontinued During This Encounter  Medication Reason  . zolpidem (AMBIEN) 5 MG tablet Patient Preference     Current Outpatient Medications:  .  Ascorbic Acid (VITAMIN C) 1000 MG tablet, Take 1,000 mg by mouth daily., Disp: , Rfl:  .  aspirin EC 81 MG tablet, Take 81 mg by mouth daily., Disp: , Rfl:  .  hydrochlorothiazide (HYDRODIURIL) 25 MG tablet, Take 25 mg by mouth daily., Disp: , Rfl:  .  ibuprofen (ADVIL) 200 MG tablet, Take 200 mg by mouth every 6 (six) hours as needed., Disp: , Rfl:  .  meloxicam (MOBIC) 15 MG tablet, Take 15 mg by mouth daily., Disp: , Rfl:  .  rosuvastatin (CRESTOR) 10 MG tablet, Take 10 mg by mouth daily., Disp: , Rfl:  .  telmisartan (MICARDIS) 40 MG tablet, Take 40 mg by mouth daily., Disp: , Rfl:   IMPRESSION:  No diagnosis found.   RECOMMENDATIONS: Dyspnea on exertion: Improving.  Since last office visit patient has lost more than 5 pounds of weight.  He is congratulated on the efforts that he has made via lifestyle modifications with watching his diet and increasing physical activity.  His echocardiogram shows preserved left ventricular systolic function without any significant valvular abnormalities.  And his nuclear stress test was reported to be low risk.  Since his symptoms were improving the plan is to proceed with improving lifestyle modifications, and aggressively managing his risk factors.  We did discuss undergoing cardiac CTA given his cardiovascular risk factors and shortness of breath which is overall improving.  Patient  would like to hold off on a cardiac CTA for now.  Patient is asked to call the office sooner than his scheduled appointment if he notices worsening of his symptoms or has typical chest discomfort as discussed in the office.  Benign essential hypertension: Currently stable.  Continue antihypertensive medications.  Managed by primary team.  Low-salt diet recommended.   Mixed hyperlipidemia: Continue statin therapy.  Managed by primary team.  Family history of premature coronary artery disease:  Currently on aspirin 81 mg p.o. daily.  Continue risk factor modifications.  Former smoker: Educated on importance of continued smoking cessation.  Class I obesity, due to excess calories Body mass index is 32.5 kg/m.  Educated on importance of lifestyle modifications to improve modifiable cardiac risk factors.  --Continue cardiac medications as reconciled in final medication list. --Return in about 6 months (around 07/21/2020) for Dyspena  and CCTA?Marland Kitchen. Or sooner if needed. --Continue follow-up with your primary care physician regarding the management of your other chronic comorbid conditions.  Evaluation and management (8mn) with time face-to-face with the patient at today's office visit to review symptoms, performing exam, reviewing labs, studies, and coordination care with the patient regarding complex decision making and discussion as state above, and documenting clinical evaluation.  Patient's questions and concerns were addressed to his satisfaction. He voices understanding of the instructions provided during this encounter.   This note was created using a voice recognition software as a result there may be grammatical errors inadvertently enclosed that do not reflect the nature of this encounter. Every attempt is made to correct such errors.  SRex Kras DO, FThiensvilleCardiovascular. PSpauldingOffice: [phone]

## 2020-02-04 NOTE — Telephone Encounter (Signed)
Spoke with Dr Brett Fairy to get her recommendations on whethershe

## 2020-02-17 DIAGNOSIS — R69 Illness, unspecified: Secondary | ICD-10-CM | POA: Diagnosis not present

## 2020-03-02 ENCOUNTER — Other Ambulatory Visit: Payer: Self-pay | Admitting: Neurology

## 2020-03-06 ENCOUNTER — Other Ambulatory Visit: Payer: Self-pay | Admitting: Neurology

## 2020-03-16 ENCOUNTER — Ambulatory Visit: Payer: Medicare HMO | Admitting: Family Medicine

## 2020-06-13 DIAGNOSIS — R69 Illness, unspecified: Secondary | ICD-10-CM | POA: Diagnosis not present

## 2020-06-23 DIAGNOSIS — Z0100 Encounter for examination of eyes and vision without abnormal findings: Secondary | ICD-10-CM | POA: Diagnosis not present

## 2020-07-20 ENCOUNTER — Ambulatory Visit: Payer: Medicare HMO | Admitting: Cardiology

## 2020-08-19 DIAGNOSIS — Z125 Encounter for screening for malignant neoplasm of prostate: Secondary | ICD-10-CM | POA: Diagnosis not present

## 2020-08-19 DIAGNOSIS — E785 Hyperlipidemia, unspecified: Secondary | ICD-10-CM | POA: Diagnosis not present

## 2020-08-19 DIAGNOSIS — R7301 Impaired fasting glucose: Secondary | ICD-10-CM | POA: Diagnosis not present

## 2020-08-26 DIAGNOSIS — Z8249 Family history of ischemic heart disease and other diseases of the circulatory system: Secondary | ICD-10-CM | POA: Diagnosis not present

## 2020-08-26 DIAGNOSIS — Z0000 Encounter for general adult medical examination without abnormal findings: Secondary | ICD-10-CM | POA: Diagnosis not present

## 2020-08-26 DIAGNOSIS — G4700 Insomnia, unspecified: Secondary | ICD-10-CM | POA: Diagnosis not present

## 2020-08-26 DIAGNOSIS — I10 Essential (primary) hypertension: Secondary | ICD-10-CM | POA: Diagnosis not present

## 2020-08-26 DIAGNOSIS — M1611 Unilateral primary osteoarthritis, right hip: Secondary | ICD-10-CM | POA: Diagnosis not present

## 2020-08-26 DIAGNOSIS — N400 Enlarged prostate without lower urinary tract symptoms: Secondary | ICD-10-CM | POA: Diagnosis not present

## 2020-08-26 DIAGNOSIS — E669 Obesity, unspecified: Secondary | ICD-10-CM | POA: Diagnosis not present

## 2020-08-26 DIAGNOSIS — M1990 Unspecified osteoarthritis, unspecified site: Secondary | ICD-10-CM | POA: Diagnosis not present

## 2020-08-26 DIAGNOSIS — R7301 Impaired fasting glucose: Secondary | ICD-10-CM | POA: Diagnosis not present

## 2020-08-26 DIAGNOSIS — R82998 Other abnormal findings in urine: Secondary | ICD-10-CM | POA: Diagnosis not present

## 2020-08-26 DIAGNOSIS — E785 Hyperlipidemia, unspecified: Secondary | ICD-10-CM | POA: Diagnosis not present

## 2020-09-06 DIAGNOSIS — R69 Illness, unspecified: Secondary | ICD-10-CM | POA: Diagnosis not present

## 2020-09-28 DIAGNOSIS — Z1212 Encounter for screening for malignant neoplasm of rectum: Secondary | ICD-10-CM | POA: Diagnosis not present

## 2020-10-23 DIAGNOSIS — Z20828 Contact with and (suspected) exposure to other viral communicable diseases: Secondary | ICD-10-CM | POA: Diagnosis not present

## 2020-11-01 DIAGNOSIS — Z1152 Encounter for screening for COVID-19: Secondary | ICD-10-CM | POA: Diagnosis not present

## 2020-11-10 DIAGNOSIS — M13849 Other specified arthritis, unspecified hand: Secondary | ICD-10-CM | POA: Diagnosis not present

## 2020-11-10 DIAGNOSIS — G5603 Carpal tunnel syndrome, bilateral upper limbs: Secondary | ICD-10-CM | POA: Diagnosis not present

## 2020-11-30 ENCOUNTER — Other Ambulatory Visit: Payer: Self-pay

## 2020-11-30 ENCOUNTER — Encounter: Payer: Self-pay | Admitting: *Deleted

## 2020-11-30 ENCOUNTER — Ambulatory Visit (AMBULATORY_SURGERY_CENTER): Payer: Self-pay | Admitting: *Deleted

## 2020-11-30 VITALS — Ht 70.0 in | Wt 218.0 lb

## 2020-11-30 DIAGNOSIS — Z1211 Encounter for screening for malignant neoplasm of colon: Secondary | ICD-10-CM

## 2020-11-30 MED ORDER — PEG 3350-KCL-NA BICARB-NACL 420 G PO SOLR: 4000.0000 mL | Freq: Once | ORAL | 0 refills | Status: AC

## 2020-11-30 NOTE — Progress Notes (Signed)
Patient is here in-person for PV. Patient denies any allergies to eggs or soy. Patient denies any problems with anesthesia/sedation. Patient denies any oxygen use at home. Patient denies taking any diet/weight loss medications or blood thinners. Patient is not being treated for MRSA or C-diff. Patient is aware of our care-partner policy and [id number] safety protocol. Marland Kitchen   COVID-19 vaccines completed on 01/05/20 x2, per patient.

## 2020-12-12 ENCOUNTER — Encounter: Payer: Self-pay | Admitting: Gastroenterology

## 2020-12-14 ENCOUNTER — Encounter: Payer: Self-pay | Admitting: Gastroenterology

## 2020-12-14 ENCOUNTER — Ambulatory Visit (AMBULATORY_SURGERY_CENTER): Payer: Medicare HMO | Admitting: Gastroenterology

## 2020-12-14 ENCOUNTER — Other Ambulatory Visit: Payer: Self-pay

## 2020-12-14 VITALS — BP 108/80 | HR 61 | Temp 98.6°F | Resp 14 | Ht 70.0 in | Wt 218.0 lb

## 2020-12-14 DIAGNOSIS — K635 Polyp of colon: Secondary | ICD-10-CM | POA: Diagnosis not present

## 2020-12-14 DIAGNOSIS — I10 Essential (primary) hypertension: Secondary | ICD-10-CM | POA: Diagnosis not present

## 2020-12-14 DIAGNOSIS — Z1211 Encounter for screening for malignant neoplasm of colon: Secondary | ICD-10-CM | POA: Diagnosis not present

## 2020-12-14 DIAGNOSIS — E785 Hyperlipidemia, unspecified: Secondary | ICD-10-CM | POA: Diagnosis not present

## 2020-12-14 DIAGNOSIS — D124 Benign neoplasm of descending colon: Secondary | ICD-10-CM

## 2020-12-14 DIAGNOSIS — Z86010 Personal history of colonic polyps: Secondary | ICD-10-CM | POA: Diagnosis not present

## 2020-12-14 MED ORDER — SODIUM CHLORIDE 0.9 % IV SOLN: 500.0000 mL | Freq: Once | INTRAVENOUS | Status: AC

## 2020-12-14 NOTE — Progress Notes (Signed)
Called to room to assist during endoscopic procedure.  Patient ID and intended procedure confirmed with present staff. Received instructions for my participation in the procedure from the performing physician.  

## 2020-12-14 NOTE — Patient Instructions (Signed)
YOU HAD AN ENDOSCOPIC PROCEDURE TODAY AT THE [facility]:   Refer to the procedure report that was given to you for any specific questions about what was found during the examination.  If the procedure report does not answer your questions, please call your gastroenterologist to clarify.  If you requested that your care partner not be given the details of your procedure findings, then the procedure report has been included in a sealed envelope for you to review at your convenience later.  YOU SHOULD EXPECT: Some feelings of bloating in the abdomen. Passage of more gas than usual.  Walking can help get rid of the air that was put into your GI tract during the procedure and reduce the bloating. If you had a lower endoscopy (such as a colonoscopy or flexible sigmoidoscopy) you may notice spotting of blood in your stool or on the toilet paper. If you underwent a bowel prep for your procedure, you may not have a normal bowel movement for a few days.  Please Note:  You might notice some irritation and congestion in your nose or some drainage.  This is from the oxygen used during your procedure.  There is no need for concern and it should clear up in a day or so.  SYMPTOMS TO REPORT IMMEDIATELY:  Following lower endoscopy (colonoscopy or flexible sigmoidoscopy):  Excessive amounts of blood in the stool  Significant tenderness or worsening of abdominal pains  Swelling of the abdomen that is new, acute  Fever of 100F or higher   For urgent or emergent issues, a gastroenterologist can be reached at any hour by calling [phone]. Do not use MyChart messaging for urgent concerns.    DIET:  We do recommend a small meal at first, but then you may proceed to your regular diet.  Drink plenty of fluids but you should avoid alcoholic beverages for 24 hours.  MEDICATIONS:  Continue present medications.  Please see handouts given to you by your recovery nurse.  Thank you for allowing us to  provide for your healthcare needs today.  ACTIVITY:  You should plan to take it easy for the rest of today and you should NOT DRIVE or use heavy machinery until tomorrow (because of the sedation medicines used during the test).    FOLLOW UP: Our staff will call the number listed on your records 48-72 hours following your procedure to check on you and address any questions or concerns that you may have regarding the information given to you following your procedure. If we do not reach you, we will leave a message.  We will attempt to reach you two times.  During this call, we will ask if you have developed any symptoms of COVID 19. If you develop any symptoms (ie: fever, flu-like symptoms, shortness of breath, cough etc.) before then, please call [phone].  If you test positive for Covid 19 in the 2 weeks post procedure, please call and report this information to us.    If any biopsies were taken you will be contacted by phone or by letter within the next 1-3 weeks.  Please call us at [phone] if you have not heard about the biopsies in 3 weeks.    SIGNATURES/CONFIDENTIALITY: You and/or your care partner have signed paperwork which will be entered into your electronic medical record.  These signatures attest to the fact that that the information above on your After Visit Summary has been reviewed and is understood.  Full responsibility of the   confidentiality of this discharge information lies with you and/or your care-partner.  

## 2020-12-14 NOTE — Progress Notes (Signed)
VS by CW  I have reviewed the patient's medical history in detail and updated the computerized patient record.  

## 2020-12-14 NOTE — Op Note (Signed)
[facility] Patient Name: [name] Procedure Date: 12/14/2020 9:37 AM MRN: [id number] Endoscopist: Mallie Mussel L. Loletha Carrow , MD Age: 65 Referring MD:  Date of Birth: 02/24/1956 Gender: Male Account #: [id number] Procedure:                Colonoscopy Indications:              Screening for colorectal malignant neoplasm (no                            polyps last colonoscopy 10/2007) Medicines:                Monitored Anesthesia Care Procedure:                Pre-Anesthesia Assessment:                           - Prior to the procedure, a History and Physical                            was performed, and patient medications and                            allergies were reviewed. The patient's tolerance of                            previous anesthesia was also reviewed. The risks                            and benefits of the procedure and the sedation                            options and risks were discussed with the patient.                            All questions were answered, and informed consent                            was obtained. Prior Anticoagulants: The patient has                            taken no previous anticoagulant or antiplatelet                            agents. ASA Grade Assessment: II - A patient with                            mild systemic disease. After reviewing the risks                            and benefits, the patient was deemed in                            satisfactory condition to undergo the procedure.  After obtaining informed consent, the colonoscope                            was passed under direct vision. Throughout the                            procedure, the patient's blood pressure, pulse, and                            oxygen saturations were monitored continuously. The                            Olympus CF-HQ190 [id number]) Colonoscope was                            introduced through the anus and  advanced to the the                            terminal ileum, with identification of the                            appendiceal orifice and IC valve. The colonoscopy                            was performed without difficulty. The patient                            tolerated the procedure well. The quality of the                            bowel preparation was good. The terminal ileum,                            ileocecal valve, appendiceal orifice, and rectum                            were photographed. The bowel preparation used was                            Plenvu. Scope In: 9:52:37 AM Scope Out: 10:07:53 AM Scope Withdrawal Time: 0 hours 13 minutes 38 seconds  Total Procedure Duration: 0 hours 15 minutes 16 seconds  Findings:                 The perianal and digital rectal examinations were                            normal.                           A diminutive polyp was found in the proximal                            descending colon. The polyp was flat. The polyp was  removed with a cold snare. Resection and retrieval                            were complete.                           A few small-mouthed diverticula were found in the                            left colon.                           The exam was otherwise without abnormality on                            direct and retroflexion views. Complications:            No immediate complications. Estimated Blood Loss:     Estimated blood loss was minimal. Impression:               - One diminutive polyp in the proximal descending                            colon, removed with a cold snare. Resected and                            retrieved.                           - Diverticulosis in the left colon.                           - The examination was otherwise normal on direct                            and retroflexion views. Recommendation:           - Patient has a contact number available  for                            emergencies. The signs and symptoms of potential                            delayed complications were discussed with the                            patient. Return to normal activities tomorrow.                            Written discharge instructions were provided to the                            patient.                           - Resume previous diet.                           -  Continue present medications.                           - Await pathology results.                           - Repeat colonoscopy is recommended for                            surveillance. The colonoscopy date will be                            determined after pathology results from today's                            exam become available for review. Ambrielle Kington L. Loletha Carrow, MD 12/14/2020 10:15:24 AM This report has been signed electronically.

## 2020-12-14 NOTE — Progress Notes (Signed)
Report given to PACU, vss 

## 2020-12-16 ENCOUNTER — Telehealth: Payer: Self-pay

## 2020-12-16 NOTE — Telephone Encounter (Signed)
LVM

## 2020-12-22 ENCOUNTER — Encounter: Payer: Self-pay | Admitting: Gastroenterology

## 2021-02-28 DIAGNOSIS — M1990 Unspecified osteoarthritis, unspecified site: Secondary | ICD-10-CM | POA: Diagnosis not present

## 2021-02-28 DIAGNOSIS — E785 Hyperlipidemia, unspecified: Secondary | ICD-10-CM | POA: Diagnosis not present

## 2021-02-28 DIAGNOSIS — R209 Unspecified disturbances of skin sensation: Secondary | ICD-10-CM | POA: Diagnosis not present

## 2021-02-28 DIAGNOSIS — N400 Enlarged prostate without lower urinary tract symptoms: Secondary | ICD-10-CM | POA: Diagnosis not present

## 2021-02-28 DIAGNOSIS — E669 Obesity, unspecified: Secondary | ICD-10-CM | POA: Diagnosis not present

## 2021-02-28 DIAGNOSIS — G4700 Insomnia, unspecified: Secondary | ICD-10-CM | POA: Diagnosis not present

## 2021-02-28 DIAGNOSIS — Z8249 Family history of ischemic heart disease and other diseases of the circulatory system: Secondary | ICD-10-CM | POA: Diagnosis not present

## 2021-02-28 DIAGNOSIS — I10 Essential (primary) hypertension: Secondary | ICD-10-CM | POA: Diagnosis not present

## 2021-02-28 DIAGNOSIS — M1611 Unilateral primary osteoarthritis, right hip: Secondary | ICD-10-CM | POA: Diagnosis not present

## 2021-02-28 DIAGNOSIS — R7301 Impaired fasting glucose: Secondary | ICD-10-CM | POA: Diagnosis not present

## 2021-07-07 DIAGNOSIS — N138 Other obstructive and reflux uropathy: Secondary | ICD-10-CM | POA: Diagnosis not present

## 2021-07-07 DIAGNOSIS — R339 Retention of urine, unspecified: Secondary | ICD-10-CM | POA: Diagnosis not present

## 2021-07-07 DIAGNOSIS — N401 Enlarged prostate with lower urinary tract symptoms: Secondary | ICD-10-CM | POA: Diagnosis not present

## 2021-07-07 DIAGNOSIS — R3589 Other polyuria: Secondary | ICD-10-CM | POA: Diagnosis not present

## 2021-08-03 DIAGNOSIS — N138 Other obstructive and reflux uropathy: Secondary | ICD-10-CM | POA: Diagnosis not present

## 2021-08-03 DIAGNOSIS — N401 Enlarged prostate with lower urinary tract symptoms: Secondary | ICD-10-CM | POA: Diagnosis not present

## 2021-08-31 DIAGNOSIS — N138 Other obstructive and reflux uropathy: Secondary | ICD-10-CM | POA: Diagnosis not present

## 2021-08-31 DIAGNOSIS — N401 Enlarged prostate with lower urinary tract symptoms: Secondary | ICD-10-CM | POA: Diagnosis not present

## 2021-09-05 DIAGNOSIS — E119 Type 2 diabetes mellitus without complications: Secondary | ICD-10-CM | POA: Diagnosis not present

## 2021-09-05 DIAGNOSIS — Z125 Encounter for screening for malignant neoplasm of prostate: Secondary | ICD-10-CM | POA: Diagnosis not present

## 2021-09-05 DIAGNOSIS — E785 Hyperlipidemia, unspecified: Secondary | ICD-10-CM | POA: Diagnosis not present

## 2021-09-05 DIAGNOSIS — I10 Essential (primary) hypertension: Secondary | ICD-10-CM | POA: Diagnosis not present

## 2021-09-12 DIAGNOSIS — R7301 Impaired fasting glucose: Secondary | ICD-10-CM | POA: Diagnosis not present

## 2021-09-12 DIAGNOSIS — E785 Hyperlipidemia, unspecified: Secondary | ICD-10-CM | POA: Diagnosis not present

## 2021-09-12 DIAGNOSIS — R69 Illness, unspecified: Secondary | ICD-10-CM | POA: Diagnosis not present

## 2021-09-12 DIAGNOSIS — M1990 Unspecified osteoarthritis, unspecified site: Secondary | ICD-10-CM | POA: Diagnosis not present

## 2021-09-12 DIAGNOSIS — E669 Obesity, unspecified: Secondary | ICD-10-CM | POA: Diagnosis not present

## 2021-09-12 DIAGNOSIS — Z0000 Encounter for general adult medical examination without abnormal findings: Secondary | ICD-10-CM | POA: Diagnosis not present

## 2021-09-12 DIAGNOSIS — R0600 Dyspnea, unspecified: Secondary | ICD-10-CM | POA: Diagnosis not present

## 2021-09-12 DIAGNOSIS — R82998 Other abnormal findings in urine: Secondary | ICD-10-CM | POA: Diagnosis not present

## 2021-09-12 DIAGNOSIS — G4700 Insomnia, unspecified: Secondary | ICD-10-CM | POA: Diagnosis not present

## 2021-09-12 DIAGNOSIS — N400 Enlarged prostate without lower urinary tract symptoms: Secondary | ICD-10-CM | POA: Diagnosis not present

## 2021-09-12 DIAGNOSIS — I10 Essential (primary) hypertension: Secondary | ICD-10-CM | POA: Diagnosis not present

## 2021-09-12 DIAGNOSIS — Z8249 Family history of ischemic heart disease and other diseases of the circulatory system: Secondary | ICD-10-CM | POA: Diagnosis not present

## 2021-09-13 DIAGNOSIS — K130 Diseases of lips: Secondary | ICD-10-CM | POA: Diagnosis not present

## 2021-09-13 DIAGNOSIS — L578 Other skin changes due to chronic exposure to nonionizing radiation: Secondary | ICD-10-CM | POA: Diagnosis not present

## 2021-10-03 NOTE — H&P (Signed)
 > [facility] [address] [place], [place] [address] Phone [phone] Fax [phone]  Patient:            [name]  Date of Birth:  1956-06-03 Date:                    08/31/2021 MRN:   [id number] Visit Type:              Office Visit Document Type:   Chart Note      This 65 year old male presents for BPH.   History of Present Illness: 1.  BPH  Obstructive BPH.  Overactive bladder symptoms with frequency.  IPSS 24. Quality of life 6. He is on tamsulosin.  Bladder scan July 07, 2021 postvoid residual 0 mL. History of diet-controlled diabetes.  Hypertension history.  Hyperlipidemia.  Cervical spine fusion history.  Bilateral hip replacement history.  Right knee surgery history.  Labs September 19, 2020 creatinine 0.9 hemoglobin 15.3 hemoglobin A1c 5.2% PSA 0.472.  Digital rectal exam prostate 3+ no distinct nodules.  Cystoscopy August 03, 2021 significant bilateral lateral lobe hypertrophy otherwise normal.  Urodynamics accomplished:  Complex cystometrogram with voiding pressure studies, bladder voiding pressure and urethral pressure profile studies.  Voiding pressure studies, intra-abdominal with rectal pressure measurements.  Complex uroflowmetry with calibrated electronic equipment.  All measurements on graph in chart.  Capacity 500 mL.  Postvoid residual 30 mL.  Peak flow 10 mL/second.  Ultrasound, transrectal prostate homogeneous symmetric volume 34.4 mL length 3.6 cm.  He will continue tamsulosin.  Patient is opted for Uro lift.  Discussed possibility of TURP.  He understands risk of Uro lift include risks of anesthesia antibiotics.  Understands risks of pain infection bleeding sepsis scarring stricture in urinary retention urine incontinence possible need for further surgical or medical intervention.  Understands expected frequency for up to 6 weeks.  With risks treatment options fully understood he has opted for Uro lift.     PROBLEM LIST:    Problem List reviewed.  Problem Description Onset Date Chronic Clinical Status  Benign prostate hyperplasia with outflow obstruction  Y       PAST MEDICAL/SURGICAL HISTORY   (Reviewed,no changes)  Disease/disorder Onset Date Management Date Comments    Cystoscopy      History of colonoscopy      Herniorrhaphy      History of total replacement of right hip joint      Surgery, cervical spine    Allergies, multiple      Allergies, seasonal      Benign prostatic hypertrophy      Bilateral carpal tunnel syndrome      Bilirubinuria      Colon polyp      Diabetes type 2      Diverticular disease      Hyperlipidemia      Hypertension      Ketonuria      Leukocytes in urine      Microscopic hematuria      Micturition frequency and polyuria      Nocturia      Proteinuria        Allergies Ingredient Reaction (Severity) Medication Name Comment  ACE INHIBITORS     Reviewed, no changes.  Family History  (Reviewed, no changes) Relationship Family Member Name Deceased Age at Death Condition Onset Age Cause of Death  Brother    Hypertension  N  Brother    Coronary artery  disease  N  Brother    Stroke  N  Brother    Heart disease  N  Father    Coronary artery disease  N  Father    Heart disease  N  Father    Hypertension  N  Father    Alcoholism  N  Mother    Heart disease  N  Mother    Hypertension  N  Mother    Coronary artery disease  N  Sister    Hypertension  N  Sister    Heart disease  N  Sister    Cancer, bladder  N  Sister    Coronary artery disease  N    Social History: Reviewed, no changes. Last detailed document date: 08/31/2021.     Review of Systems System Neg/Pos Details  Constitutional Negative Fatigue, Fever and Night sweats.  ENMT Negative Ear drainage, Hearing loss and Nasal drainage.  Eyes Negative Eye discharge, Vision changes and Vision loss.  Respiratory Negative Cough, Dyspnea and Wheezing.  Cardio Negative Chest pain, Claudication and Irregular  heartbeat/palpitations.  GI Negative Abdominal pain, Constipation, Diarrhea and Vomiting.  GU Positive Urinary frequency.  GU Negative Dysuria, Hematuria, Polyuria (Genitourinary) and Urinary incontinence.  Endocrine Negative Cold intolerance, Heat intolerance, Polydipsia, Polyphagia and Polyuria (Endocrine).  Neuro Negative Gait disturbance.  Psych Negative Anxiety and Depression.  Integumentary Negative Pruritus and Rash.  MS Negative Joint swelling and Muscle weakness.  Hema/Lymph Negative Easy bleeding and Easy bruising.  Allergic/Immuno Negative Environmental allergies and Food allergies.  Reproductive Negative Penile discharge.   Vital Signs    Height Time ft in cm Last Measured Height Position  3:46 PM 5.0 6.00 167.64 08/31/2021 0   Weight/BSA/BMI Time lb oz kg Context BMI kg/m2 BSA m2  3:46 PM 218.00  98.883  35.19    Blood Pressure Time BP mm/Hg Position Side Site Method Cuff Size  3:46 PM 171/82        Temperature/Pulse/Respiration Time Temp F Temp C Temp Site Pulse/min Pattern Resp/ min  3:46 PM    59     Measured By Time Measured by  3:46 PM [place]    Physical Exam  Exam Findings Details  Constitutional Normal Well developed.  Neck Exam Normal Inspection - Normal. Palpation - Normal. Thyroid  gland - Normal.  Respiratory Normal Inspection - Normal. Auscultation - Normal.  Cardiovascular Normal Regular rate and rhythm. No murmurs, gallops, or rubs.  Vascular Normal Pulses - Dorsalis pedis: Normal.  Abdomen Normal No abdominal tenderness. No hepatic enlargement. No spleen enlargement.  Genitourinary Normal Penis - Normal. Scrotum - Normal. Testes - Normal.  Rectal * Prostate - Findings: 3+ enlarged (40-50 grams).  Skin Normal Inspection - Normal.  Extremity Normal No edema.  Psychiatric Normal Orientation - Oriented to time, place, person & situation. Appropriate mood and affect.     Completed Orders (this encounter) Order Details Reason Side  Interpretation Result Initial Treatment Date Region  Urinalysis, non-automated, w/scope    see detail Method of collection: void. Clarity: Clear. Urine dipstick: Color: yellow. Glucose: negative. Bilirubin: negative. Ketones: negative. Specific Gravity: 1.010. Blood: trace. pH: 8.5.  Protein: trace. Urobilinogen: normal. Nitrite: negative. Leukocytes: trace.    Diet education (procedure)          Assessment/Plan # Detail Type Description   1. Assessment Enlarged prostate with lower urinary tract symptoms (N40.1).   Plan Orders The patient had the following test(s) completed today: Urinalysis, non-automated, w/scope.  2. Assessment Essential (primary) hypertension (I10).   Plan Orders Today's instructions / counseling include(s) Diet education (procedure).       Assessment Details Obstructive BPH on tamsulosin. Plan Details Plan Uro lift at his convenience.  Risks benefits treatment options discussed in detail. Instruction(s)/Education: Diet education (procedure)    Medication Reconciliation Medications reconciled today.   Medications (Started, Stopped or Renewed this visit) Start Date Medication Directions PRN Status PRN Reason Instruction Stop Date  05/28/2021 hydrochlorothiazide  25 mg tablet TAKE 1 TABLET BY MOUTH ONCE DAILY FOR BLOOD PRESSURE AND FOR FLUID N     05/01/2021 meloxicam 15 mg tablet  N     11/12/2020 neomycin 3.5 mg/g-polymyxin B  10,000 unit/g-dexameth 0.1 % eye oint APPLY 1 INCH OF OINTMENT INTO EACH EYE LOWER EYELID BEFORE BEDTIME N     11/30/2020 peg-electrolyte solution 420 gram oral solution USE AS DIRECTED 1 DOSE N     01/26/2021 prednisolone acetate 1 % eye drops,suspension  N     06/21/2021 rosuvastatin  10 mg tablet TAKE 1 TABLET BY MOUTH ONCE DAILY N     07/07/2021 tamsulosin ER 0.4 mg capsule,extended release 24 hr take 1 capsule by oral route  every day 1/2 hour following the same meal each day N     05/08/2021 telmisartan 40 mg tablet  N      10/30/2020 tramadol 50 mg tablet TAKE 1 TABLET BY MOUTH AS NEEDED EVERY 8 HOURS WHEN NECESSARY FOR 30 DAYS N       Orders: Office labs: Assessment Test Interpretation Value  N40.1 Urinalysis, non-automated, w/scope see detail Method of collection: void. Clarity: Clear. Urine dipstick: Color: yellow. Glucose: negative. Bilirubin: negative. Ketones: negative. Specific Gravity: 1.010. Blood: trace. pH: 8.5.  Protein: trace. Urobilinogen: normal. Nitrite: negative. Leukocytes: trace.  Instruction(s)/Education: Assessment Instruction  I10 Diet education (procedure)    Counseling Details: Counseling / educational factors reviewed.   Provider:  GWENITH CHARLIE ORN 08/31/2021 4:13 PM  Document generated by:  Charlie Puschinsky 08/31/2021 04:13 PM   Electronically signed by CHARLIE MICAEL GWENITH MD on 08/31/2021 04:13 PM     Electronically signed by: Charlie Fallow Puschinsky, MD 10/03/21 1723

## 2021-10-04 DIAGNOSIS — N401 Enlarged prostate with lower urinary tract symptoms: Secondary | ICD-10-CM | POA: Diagnosis not present

## 2021-10-04 DIAGNOSIS — Z01810 Encounter for preprocedural cardiovascular examination: Secondary | ICD-10-CM | POA: Diagnosis not present

## 2021-10-04 DIAGNOSIS — I10 Essential (primary) hypertension: Secondary | ICD-10-CM | POA: Diagnosis not present

## 2021-10-04 DIAGNOSIS — N138 Other obstructive and reflux uropathy: Secondary | ICD-10-CM | POA: Diagnosis not present

## 2021-10-04 DIAGNOSIS — Z79899 Other long term (current) drug therapy: Secondary | ICD-10-CM | POA: Diagnosis not present

## 2021-10-04 NOTE — H&P (Signed)
 See H&P.  No changes.  Plan for UroLift.  Patient resents risk of anesthesia and antibiotics.  Plan to pretreat with Rocephin .  Patient resents risks of urinary tension urinary continence of infection bleeding sepsis scarring.  Understands expected urinary frequency for up to 6 weeks.  With risks treatment options fully understood opted to proceed with UroLift.   Electronically signed by: Charlie Fallow Puschinsky, MD 10/04/21 [phone]

## 2021-10-19 DIAGNOSIS — N138 Other obstructive and reflux uropathy: Secondary | ICD-10-CM | POA: Diagnosis not present

## 2021-10-19 DIAGNOSIS — N401 Enlarged prostate with lower urinary tract symptoms: Secondary | ICD-10-CM | POA: Diagnosis not present

## 2021-11-08 ENCOUNTER — Other Ambulatory Visit: Payer: Self-pay

## 2021-11-08 ENCOUNTER — Ambulatory Visit: Payer: Medicare HMO | Admitting: Internal Medicine

## 2021-11-08 ENCOUNTER — Encounter: Payer: Self-pay | Admitting: Internal Medicine

## 2021-11-08 VITALS — BP 124/64 | HR 62 | Ht 70.0 in | Wt 224.2 lb

## 2021-11-08 DIAGNOSIS — R0609 Other forms of dyspnea: Secondary | ICD-10-CM

## 2021-11-08 DIAGNOSIS — Z87891 Personal history of nicotine dependence: Secondary | ICD-10-CM | POA: Diagnosis not present

## 2021-11-08 DIAGNOSIS — R059 Cough, unspecified: Secondary | ICD-10-CM

## 2021-11-08 MED ORDER — SPIRIVA RESPIMAT 1.25 MCG/ACT IN AERS: 2.0000 | INHALATION_SPRAY | Freq: Every day | RESPIRATORY_TRACT | 0 refills | Status: AC

## 2021-11-08 NOTE — Assessment & Plan Note (Addendum)
Low-dose CT lung cancer screening is recommended for patients who are 70-68 years of age with a 20+ pack-year history of smoking, and who are currently smoking or quit <=15 years ago. No coughing up blood  No unintentional weight loss of > 15 pounds in the last 6 months  >>> eligible for next 11 years so referred for shared decision making (this will help answer his questions about pneumoconiosis/occupational lung dz  as well   Discussed in detail all the  indications, usual  risks and alternatives  relative to the benefits with patient who agrees to proceed with w/u as outlined.

## 2021-11-08 NOTE — Patient Instructions (Addendum)
I will refer you our screening program call Low Dose CT scanning  Start spriva 1.25 mcg  x 4 puffs each am for at least two weeks and see if your exercise tolerance improves   We will schedule PFTs next available and  call you with the results   Pulmonary follow up is as needed

## 2021-11-08 NOTE — Assessment & Plan Note (Addendum)
Onset summer 2022 "gives out after an hour of yardwork"  - 11/08/2021  After extensive coaching inhaler device,  effectiveness =    90% with respimat from baseline of 50%    When respiratory symptoms begin or become refractory well after a patient reports complete smoking cessation,  Especially when this wasn't the case while they were smoking, a red flag is raised based on the work of Dr Kris Mouton which states: if you quit smoking when your best day FEV1 is still well preserved it is highly unlikely you will progress to severe disease.  That is to say, once the smoking stops,  the symptoms should not suddenly erupt or markedly worsen.  If so, the differential diagnosis should include  Obesity/deconditioning (related to chronic hip/back pain limiting aerobic activity),  LPR/Reflux/Aspiration syndromes,  occult CHF, or  especially side effect of medications commonly used in this population (none of the usual suspects listed.  If he has copd at all he is a mild version with group B symptoms and risk so reasonable to rechallenge with full dose  spiriva x 2 weeks of samples and d/c if not better ex tol (like high octane fuel trial) then bring back here for pfts to see if trial of stiolto warranted or not.              Each maintenance medication was reviewed in detail including emphasizing most importantly the difference between maintenance and prns and under what circumstances the prns are to be triggered using an action plan format where appropriate.  Total time for H and P, chart review, counseling, reviewing [facility]  device(s) and generating customized AVS unique to this new pt office visit / same day charting  > 45 min

## 2021-11-08 NOTE — Progress Notes (Signed)
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    Dyspnea:  slowed down by hips more than breathing x sev years  Gives out After an hour of yardwork/ does ok on several flights  Cough: not a problem Sleep: flat bed one pillow SABA use: never / no better on spiriva 1.25 2 q am but technique note ideal  No obvious day to day or daytime variability or assoc excess/ purulent sputum or mucus plugs or hemoptysis or cp or chest tightness, subjective wheeze or overt sinus or hb symptoms.   Sleeping as above  without nocturnal  or early am exacerbation  of respiratory  c/o's or need for noct saba. Also denies any obvious fluctuation of symptoms with weather or environmental changes or other aggravating or alleviating factors except as outlined above   No unusual exposure hx or h/o childhood pna/ asthma or knowledge of premature birth.  Current Allergies, Complete Past Medical History, Past Surgical History, Family History, and Social History were reviewed in [employer] record.  ROS  The following are not active complaints unless bolded Hoarseness, sore throat, dysphagia, dental problems, itching, sneezing,  nasal congestion or discharge of excess mucus or purulent secretions, ear ache,   fever, chills, sweats, unintended wt loss or wt gain, classically pleuritic or exertional cp,  orthopnea pnd or arm/hand swelling  or leg swelling, presyncope, palpitations, abdominal pain, anorexia, nausea, vomiting, diarrhea  or change in bowel habits or change in bladder habits, change in stools or change in urine, dysuria, hematuria,  rash, arthralgias,  visual complaints, headache, numbness, weakness or ataxia or problems with walking or coordination,  change in mood or  memory.           Past Medical History:  Diagnosis Date   Arthritis    Bone infection ([place])    age 69 - ? staph    BPH (benign prostatic hyperplasia)    DJD (degenerative joint disease)    History of kidney stones    as a teenager    HLD (hyperlipidemia)    Hypertension     Outpatient Medications Prior to Visit  Medication Sig Dispense Refill   Ascorbic Acid (VITAMIN C) 1000 MG tablet Take 1,000 mg by mouth daily.     aspirin EC 81 MG tablet Take 81 mg by mouth daily.     hydrochlorothiazide (HYDRODIURIL) 25 MG tablet Take 25 mg by mouth daily.     ibuprofen (ADVIL) 200 MG tablet Take 200 mg by mouth every 6 (six) hours as needed.     meloxicam (MOBIC) 15 MG tablet Take 15 mg by mouth daily.     niacin 500 MG tablet Take 500 mg by mouth at bedtime.     prednisoLONE acetate (PRED FORTE) 1 % ophthalmic suspension 1 drop 2 (two) times daily.     rosuvastatin (CRESTOR) 10 MG tablet Take 10 mg by mouth daily.     telmisartan (MICARDIS) 40 MG tablet Take 40 mg by mouth daily.     Facility-Administered Medications Prior to Visit  Medication  Dose Route Frequency Provider Last Rate Last Admin   0.9 %  sodium chloride infusion  500 mL Intravenous Once Danis, Estill Cotta III, MD         Objective:     BP 124/64 (BP Location: Left Arm, Cuff Size: Normal)    Pulse 62    Ht 5\' 10"  (1.778 m)    Wt 224 lb 3.2 oz (101.7 kg)    SpO2 96% Comment: RA   BMI 32.17 kg/m   SpO2: 96 % (RA)  Mildly obese amb wm nad    HEENT : pt wearing mask not removed for exam due to covid -19 concerns.    NECK :  without JVD/Nodes/TM/ nl carotid upstrokes bilaterally   LUNGS: no acc muscle use,  Nl contour chest which is clear to A and P bilaterally without cough on insp or exp maneuvers   CV:  RRR  no s3 or murmur or increase in P2, and no edema   ABD: mod obese soft and nontender with  nl inspiratory excursion in the supine position. No bruits or organomegaly appreciated, bowel sounds nl  MS:  Nl gait/ ext warm without deformities, calf tenderness, cyanosis or clubbing No obvious joint restrictions   SKIN: warm and dry without lesions    NEURO:  alert, approp, nl sensorium with  no motor or cerebellar deficits apparent.    Cxr per pt per Dr Dagmar Hait ok in Dec 2022       Assessment   Dyspnea on exertion Onset summer 2022 "gives out after an hour of yardwork"  - 11/08/2021  After extensive coaching inhaler device,  effectiveness =    90% with respimat from baseline of 50%    When respiratory symptoms begin or become refractory well after a patient reports complete smoking cessation,  Especially when this wasn't the case while they were smoking, a red flag is raised based on the work of Dr Kris Mouton which states: if you quit smoking when your best day FEV1 is still well preserved it is highly unlikely you will progress to severe disease.  That is to say, once the smoking stops,  the symptoms should not suddenly erupt or markedly worsen.  If so, the differential diagnosis should include  Obesity/deconditioning (related to chronic hip/back pain limiting aerobic activity),  LPR/Reflux/Aspiration syndromes,  occult CHF, or  especially side effect of medications commonly used in this population (none of the usual suspects listed.  If he has copd at all he is a mild version with group B symptoms and risk so reasonable to rechallenge with full dose  spiriva x 2 weeks of samples and d/c if not better ex tol (like high octane fuel trial) then bring back here for pfts to see if trial of stiolto warranted or not.     Former smoker Low-dose CT lung cancer screening is recommended for patients who are 23-35 years of age with a 20+ pack-year history of smoking, and who are currently smoking or quit <=15 years ago. No coughing up blood  No unintentional weight loss of > 15 pounds in the  last 6 months  >>> eligible for next 11 years so referred for shared decision making (this will help answer his questions about pneumoconiosis/occupational lung dz  as well   Discussed in detail all the  indications, usual  risks and alternatives  relative to the benefits with patient who agrees to proceed with w/u as outlined.        Each maintenance medication  was reviewed in detail including emphasizing most importantly the difference between maintenance and prns and under what circumstances the prns are to be triggered using an action plan format where appropriate.  Total time for H and P, chart review, counseling, reviewing [facility]  device(s) and generating customized AVS unique to this new pt office visit / same day charting  > 45 min        Christinia Gully, MD 11/08/2021

## 2021-12-19 DIAGNOSIS — Z6835 Body mass index (BMI) 35.0-35.9, adult: Secondary | ICD-10-CM | POA: Diagnosis not present

## 2021-12-19 DIAGNOSIS — N401 Enlarged prostate with lower urinary tract symptoms: Secondary | ICD-10-CM | POA: Diagnosis not present

## 2022-03-07 DIAGNOSIS — E669 Obesity, unspecified: Secondary | ICD-10-CM | POA: Diagnosis not present

## 2022-03-07 DIAGNOSIS — R7301 Impaired fasting glucose: Secondary | ICD-10-CM | POA: Diagnosis not present

## 2022-03-07 DIAGNOSIS — R0600 Dyspnea, unspecified: Secondary | ICD-10-CM | POA: Diagnosis not present

## 2022-03-07 DIAGNOSIS — G4700 Insomnia, unspecified: Secondary | ICD-10-CM | POA: Diagnosis not present

## 2022-03-07 DIAGNOSIS — Z8249 Family history of ischemic heart disease and other diseases of the circulatory system: Secondary | ICD-10-CM | POA: Diagnosis not present

## 2022-03-07 DIAGNOSIS — Z7184 Encounter for health counseling related to travel: Secondary | ICD-10-CM | POA: Diagnosis not present

## 2022-03-07 DIAGNOSIS — E785 Hyperlipidemia, unspecified: Secondary | ICD-10-CM | POA: Diagnosis not present

## 2022-03-07 DIAGNOSIS — N400 Enlarged prostate without lower urinary tract symptoms: Secondary | ICD-10-CM | POA: Diagnosis not present

## 2022-03-07 DIAGNOSIS — M1990 Unspecified osteoarthritis, unspecified site: Secondary | ICD-10-CM | POA: Diagnosis not present

## 2022-03-07 DIAGNOSIS — I10 Essential (primary) hypertension: Secondary | ICD-10-CM | POA: Diagnosis not present

## 2022-03-07 DIAGNOSIS — R69 Illness, unspecified: Secondary | ICD-10-CM | POA: Diagnosis not present

## 2022-03-20 DIAGNOSIS — H5203 Hypermetropia, bilateral: Secondary | ICD-10-CM | POA: Diagnosis not present

## 2022-03-20 DIAGNOSIS — N401 Enlarged prostate with lower urinary tract symptoms: Secondary | ICD-10-CM | POA: Diagnosis not present

## 2022-03-20 DIAGNOSIS — H52209 Unspecified astigmatism, unspecified eye: Secondary | ICD-10-CM | POA: Diagnosis not present

## 2022-03-20 DIAGNOSIS — H524 Presbyopia: Secondary | ICD-10-CM | POA: Diagnosis not present

## 2022-03-20 DIAGNOSIS — N138 Other obstructive and reflux uropathy: Secondary | ICD-10-CM | POA: Diagnosis not present

## 2022-07-24 DIAGNOSIS — N401 Enlarged prostate with lower urinary tract symptoms: Secondary | ICD-10-CM | POA: Diagnosis not present

## 2022-07-24 DIAGNOSIS — N138 Other obstructive and reflux uropathy: Secondary | ICD-10-CM | POA: Diagnosis not present

## 2022-07-24 DIAGNOSIS — R3589 Other polyuria: Secondary | ICD-10-CM | POA: Diagnosis not present

## 2022-09-18 DIAGNOSIS — R5383 Other fatigue: Secondary | ICD-10-CM | POA: Diagnosis not present

## 2022-09-18 DIAGNOSIS — Z1212 Encounter for screening for malignant neoplasm of rectum: Secondary | ICD-10-CM | POA: Diagnosis not present

## 2022-09-18 DIAGNOSIS — R7301 Impaired fasting glucose: Secondary | ICD-10-CM | POA: Diagnosis not present

## 2022-09-18 DIAGNOSIS — I10 Essential (primary) hypertension: Secondary | ICD-10-CM | POA: Diagnosis not present

## 2022-09-18 DIAGNOSIS — Z125 Encounter for screening for malignant neoplasm of prostate: Secondary | ICD-10-CM | POA: Diagnosis not present

## 2022-09-18 DIAGNOSIS — E785 Hyperlipidemia, unspecified: Secondary | ICD-10-CM | POA: Diagnosis not present

## 2022-09-24 DIAGNOSIS — N400 Enlarged prostate without lower urinary tract symptoms: Secondary | ICD-10-CM | POA: Diagnosis not present

## 2022-09-24 DIAGNOSIS — M5441 Lumbago with sciatica, right side: Secondary | ICD-10-CM | POA: Diagnosis not present

## 2022-09-24 DIAGNOSIS — R7301 Impaired fasting glucose: Secondary | ICD-10-CM | POA: Diagnosis not present

## 2022-09-24 DIAGNOSIS — I10 Essential (primary) hypertension: Secondary | ICD-10-CM | POA: Diagnosis not present

## 2022-09-24 DIAGNOSIS — E669 Obesity, unspecified: Secondary | ICD-10-CM | POA: Diagnosis not present

## 2022-09-24 DIAGNOSIS — E785 Hyperlipidemia, unspecified: Secondary | ICD-10-CM | POA: Diagnosis not present

## 2022-09-24 DIAGNOSIS — Z8249 Family history of ischemic heart disease and other diseases of the circulatory system: Secondary | ICD-10-CM | POA: Diagnosis not present

## 2022-09-24 DIAGNOSIS — M1990 Unspecified osteoarthritis, unspecified site: Secondary | ICD-10-CM | POA: Diagnosis not present

## 2022-09-24 DIAGNOSIS — G4700 Insomnia, unspecified: Secondary | ICD-10-CM | POA: Diagnosis not present

## 2022-09-24 DIAGNOSIS — Z0000 Encounter for general adult medical examination without abnormal findings: Secondary | ICD-10-CM | POA: Diagnosis not present

## 2022-09-24 DIAGNOSIS — R69 Illness, unspecified: Secondary | ICD-10-CM | POA: Diagnosis not present

## 2022-09-24 DIAGNOSIS — R82998 Other abnormal findings in urine: Secondary | ICD-10-CM | POA: Diagnosis not present

## 2022-10-31 DIAGNOSIS — M9903 Segmental and somatic dysfunction of lumbar region: Secondary | ICD-10-CM | POA: Diagnosis not present

## 2022-10-31 DIAGNOSIS — M62830 Muscle spasm of back: Secondary | ICD-10-CM | POA: Diagnosis not present

## 2022-10-31 DIAGNOSIS — M546 Pain in thoracic spine: Secondary | ICD-10-CM | POA: Diagnosis not present

## 2022-10-31 DIAGNOSIS — M62838 Other muscle spasm: Secondary | ICD-10-CM | POA: Diagnosis not present

## 2022-10-31 DIAGNOSIS — M542 Cervicalgia: Secondary | ICD-10-CM | POA: Diagnosis not present

## 2022-10-31 DIAGNOSIS — M9901 Segmental and somatic dysfunction of cervical region: Secondary | ICD-10-CM | POA: Diagnosis not present

## 2022-10-31 DIAGNOSIS — M9902 Segmental and somatic dysfunction of thoracic region: Secondary | ICD-10-CM | POA: Diagnosis not present

## 2022-10-31 DIAGNOSIS — M545 Low back pain, unspecified: Secondary | ICD-10-CM | POA: Diagnosis not present

## 2022-11-05 DIAGNOSIS — M545 Low back pain, unspecified: Secondary | ICD-10-CM | POA: Diagnosis not present

## 2022-11-05 DIAGNOSIS — M9901 Segmental and somatic dysfunction of cervical region: Secondary | ICD-10-CM | POA: Diagnosis not present

## 2022-11-05 DIAGNOSIS — M62838 Other muscle spasm: Secondary | ICD-10-CM | POA: Diagnosis not present

## 2022-11-05 DIAGNOSIS — M546 Pain in thoracic spine: Secondary | ICD-10-CM | POA: Diagnosis not present

## 2022-11-05 DIAGNOSIS — M9903 Segmental and somatic dysfunction of lumbar region: Secondary | ICD-10-CM | POA: Diagnosis not present

## 2022-11-05 DIAGNOSIS — M542 Cervicalgia: Secondary | ICD-10-CM | POA: Diagnosis not present

## 2022-11-05 DIAGNOSIS — M62830 Muscle spasm of back: Secondary | ICD-10-CM | POA: Diagnosis not present

## 2022-11-05 DIAGNOSIS — M9902 Segmental and somatic dysfunction of thoracic region: Secondary | ICD-10-CM | POA: Diagnosis not present

## 2022-11-08 DIAGNOSIS — M9903 Segmental and somatic dysfunction of lumbar region: Secondary | ICD-10-CM | POA: Diagnosis not present

## 2022-11-08 DIAGNOSIS — M9901 Segmental and somatic dysfunction of cervical region: Secondary | ICD-10-CM | POA: Diagnosis not present

## 2022-11-08 DIAGNOSIS — M545 Low back pain, unspecified: Secondary | ICD-10-CM | POA: Diagnosis not present

## 2022-11-08 DIAGNOSIS — M546 Pain in thoracic spine: Secondary | ICD-10-CM | POA: Diagnosis not present

## 2022-11-08 DIAGNOSIS — M9902 Segmental and somatic dysfunction of thoracic region: Secondary | ICD-10-CM | POA: Diagnosis not present

## 2022-11-08 DIAGNOSIS — M542 Cervicalgia: Secondary | ICD-10-CM | POA: Diagnosis not present

## 2022-11-12 DIAGNOSIS — M546 Pain in thoracic spine: Secondary | ICD-10-CM | POA: Diagnosis not present

## 2022-11-12 DIAGNOSIS — M9901 Segmental and somatic dysfunction of cervical region: Secondary | ICD-10-CM | POA: Diagnosis not present

## 2022-11-12 DIAGNOSIS — M545 Low back pain, unspecified: Secondary | ICD-10-CM | POA: Diagnosis not present

## 2022-11-12 DIAGNOSIS — M9902 Segmental and somatic dysfunction of thoracic region: Secondary | ICD-10-CM | POA: Diagnosis not present

## 2022-11-12 DIAGNOSIS — M542 Cervicalgia: Secondary | ICD-10-CM | POA: Diagnosis not present

## 2022-11-12 DIAGNOSIS — M9903 Segmental and somatic dysfunction of lumbar region: Secondary | ICD-10-CM | POA: Diagnosis not present

## 2022-11-21 DIAGNOSIS — M542 Cervicalgia: Secondary | ICD-10-CM | POA: Diagnosis not present

## 2022-11-21 DIAGNOSIS — M9903 Segmental and somatic dysfunction of lumbar region: Secondary | ICD-10-CM | POA: Diagnosis not present

## 2022-11-21 DIAGNOSIS — M9901 Segmental and somatic dysfunction of cervical region: Secondary | ICD-10-CM | POA: Diagnosis not present

## 2022-11-21 DIAGNOSIS — M545 Low back pain, unspecified: Secondary | ICD-10-CM | POA: Diagnosis not present

## 2022-11-21 DIAGNOSIS — M9902 Segmental and somatic dysfunction of thoracic region: Secondary | ICD-10-CM | POA: Diagnosis not present

## 2022-11-21 DIAGNOSIS — M546 Pain in thoracic spine: Secondary | ICD-10-CM | POA: Diagnosis not present

## 2022-11-22 DIAGNOSIS — M545 Low back pain, unspecified: Secondary | ICD-10-CM | POA: Diagnosis not present

## 2022-11-22 DIAGNOSIS — M9903 Segmental and somatic dysfunction of lumbar region: Secondary | ICD-10-CM | POA: Diagnosis not present

## 2022-11-22 DIAGNOSIS — M542 Cervicalgia: Secondary | ICD-10-CM | POA: Diagnosis not present

## 2022-11-22 DIAGNOSIS — M9901 Segmental and somatic dysfunction of cervical region: Secondary | ICD-10-CM | POA: Diagnosis not present

## 2022-11-22 DIAGNOSIS — M546 Pain in thoracic spine: Secondary | ICD-10-CM | POA: Diagnosis not present

## 2022-11-22 DIAGNOSIS — M9902 Segmental and somatic dysfunction of thoracic region: Secondary | ICD-10-CM | POA: Diagnosis not present

## 2022-11-29 DIAGNOSIS — M545 Low back pain, unspecified: Secondary | ICD-10-CM | POA: Diagnosis not present

## 2022-11-29 DIAGNOSIS — M9902 Segmental and somatic dysfunction of thoracic region: Secondary | ICD-10-CM | POA: Diagnosis not present

## 2022-11-29 DIAGNOSIS — M9901 Segmental and somatic dysfunction of cervical region: Secondary | ICD-10-CM | POA: Diagnosis not present

## 2022-11-29 DIAGNOSIS — M9903 Segmental and somatic dysfunction of lumbar region: Secondary | ICD-10-CM | POA: Diagnosis not present

## 2022-11-29 DIAGNOSIS — M542 Cervicalgia: Secondary | ICD-10-CM | POA: Diagnosis not present

## 2022-11-29 DIAGNOSIS — M546 Pain in thoracic spine: Secondary | ICD-10-CM | POA: Diagnosis not present

## 2022-12-01 DIAGNOSIS — M545 Low back pain, unspecified: Secondary | ICD-10-CM | POA: Diagnosis not present

## 2022-12-01 DIAGNOSIS — M9902 Segmental and somatic dysfunction of thoracic region: Secondary | ICD-10-CM | POA: Diagnosis not present

## 2022-12-01 DIAGNOSIS — M9903 Segmental and somatic dysfunction of lumbar region: Secondary | ICD-10-CM | POA: Diagnosis not present

## 2022-12-01 DIAGNOSIS — M546 Pain in thoracic spine: Secondary | ICD-10-CM | POA: Diagnosis not present

## 2022-12-01 DIAGNOSIS — M542 Cervicalgia: Secondary | ICD-10-CM | POA: Diagnosis not present

## 2022-12-01 DIAGNOSIS — M9901 Segmental and somatic dysfunction of cervical region: Secondary | ICD-10-CM | POA: Diagnosis not present

## 2022-12-03 DIAGNOSIS — M9902 Segmental and somatic dysfunction of thoracic region: Secondary | ICD-10-CM | POA: Diagnosis not present

## 2022-12-03 DIAGNOSIS — M546 Pain in thoracic spine: Secondary | ICD-10-CM | POA: Diagnosis not present

## 2022-12-03 DIAGNOSIS — M545 Low back pain, unspecified: Secondary | ICD-10-CM | POA: Diagnosis not present

## 2022-12-03 DIAGNOSIS — M9901 Segmental and somatic dysfunction of cervical region: Secondary | ICD-10-CM | POA: Diagnosis not present

## 2022-12-03 DIAGNOSIS — M9903 Segmental and somatic dysfunction of lumbar region: Secondary | ICD-10-CM | POA: Diagnosis not present

## 2022-12-03 DIAGNOSIS — M542 Cervicalgia: Secondary | ICD-10-CM | POA: Diagnosis not present

## 2022-12-04 DIAGNOSIS — M9902 Segmental and somatic dysfunction of thoracic region: Secondary | ICD-10-CM | POA: Diagnosis not present

## 2022-12-04 DIAGNOSIS — M546 Pain in thoracic spine: Secondary | ICD-10-CM | POA: Diagnosis not present

## 2022-12-04 DIAGNOSIS — M542 Cervicalgia: Secondary | ICD-10-CM | POA: Diagnosis not present

## 2022-12-04 DIAGNOSIS — M9903 Segmental and somatic dysfunction of lumbar region: Secondary | ICD-10-CM | POA: Diagnosis not present

## 2022-12-04 DIAGNOSIS — M545 Low back pain, unspecified: Secondary | ICD-10-CM | POA: Diagnosis not present

## 2022-12-04 DIAGNOSIS — M9901 Segmental and somatic dysfunction of cervical region: Secondary | ICD-10-CM | POA: Diagnosis not present

## 2022-12-06 DIAGNOSIS — M9901 Segmental and somatic dysfunction of cervical region: Secondary | ICD-10-CM | POA: Diagnosis not present

## 2022-12-06 DIAGNOSIS — M545 Low back pain, unspecified: Secondary | ICD-10-CM | POA: Diagnosis not present

## 2022-12-06 DIAGNOSIS — M542 Cervicalgia: Secondary | ICD-10-CM | POA: Diagnosis not present

## 2022-12-06 DIAGNOSIS — M546 Pain in thoracic spine: Secondary | ICD-10-CM | POA: Diagnosis not present

## 2022-12-06 DIAGNOSIS — M9903 Segmental and somatic dysfunction of lumbar region: Secondary | ICD-10-CM | POA: Diagnosis not present

## 2022-12-06 DIAGNOSIS — M9902 Segmental and somatic dysfunction of thoracic region: Secondary | ICD-10-CM | POA: Diagnosis not present

## 2022-12-10 DIAGNOSIS — M545 Low back pain, unspecified: Secondary | ICD-10-CM | POA: Diagnosis not present

## 2022-12-10 DIAGNOSIS — M9903 Segmental and somatic dysfunction of lumbar region: Secondary | ICD-10-CM | POA: Diagnosis not present

## 2022-12-10 DIAGNOSIS — M9902 Segmental and somatic dysfunction of thoracic region: Secondary | ICD-10-CM | POA: Diagnosis not present

## 2022-12-10 DIAGNOSIS — M542 Cervicalgia: Secondary | ICD-10-CM | POA: Diagnosis not present

## 2022-12-10 DIAGNOSIS — M546 Pain in thoracic spine: Secondary | ICD-10-CM | POA: Diagnosis not present

## 2022-12-10 DIAGNOSIS — M9901 Segmental and somatic dysfunction of cervical region: Secondary | ICD-10-CM | POA: Diagnosis not present

## 2023-03-27 DIAGNOSIS — Z7184 Encounter for health counseling related to travel: Secondary | ICD-10-CM | POA: Diagnosis not present

## 2023-03-27 DIAGNOSIS — M5441 Lumbago with sciatica, right side: Secondary | ICD-10-CM | POA: Diagnosis not present

## 2023-03-27 DIAGNOSIS — F17200 Nicotine dependence, unspecified, uncomplicated: Secondary | ICD-10-CM | POA: Diagnosis not present

## 2023-03-27 DIAGNOSIS — E785 Hyperlipidemia, unspecified: Secondary | ICD-10-CM | POA: Diagnosis not present

## 2023-03-27 DIAGNOSIS — G4700 Insomnia, unspecified: Secondary | ICD-10-CM | POA: Diagnosis not present

## 2023-03-27 DIAGNOSIS — Z8249 Family history of ischemic heart disease and other diseases of the circulatory system: Secondary | ICD-10-CM | POA: Diagnosis not present

## 2023-03-27 DIAGNOSIS — R7301 Impaired fasting glucose: Secondary | ICD-10-CM | POA: Diagnosis not present

## 2023-03-27 DIAGNOSIS — R0600 Dyspnea, unspecified: Secondary | ICD-10-CM | POA: Diagnosis not present

## 2023-03-27 DIAGNOSIS — I10 Essential (primary) hypertension: Secondary | ICD-10-CM | POA: Diagnosis not present

## 2023-03-27 DIAGNOSIS — E669 Obesity, unspecified: Secondary | ICD-10-CM | POA: Diagnosis not present

## 2023-03-27 DIAGNOSIS — M1990 Unspecified osteoarthritis, unspecified site: Secondary | ICD-10-CM | POA: Diagnosis not present

## 2023-03-27 DIAGNOSIS — N400 Enlarged prostate without lower urinary tract symptoms: Secondary | ICD-10-CM | POA: Diagnosis not present

## 2023-06-18 DIAGNOSIS — N138 Other obstructive and reflux uropathy: Secondary | ICD-10-CM | POA: Diagnosis not present

## 2023-06-18 DIAGNOSIS — N401 Enlarged prostate with lower urinary tract symptoms: Secondary | ICD-10-CM | POA: Diagnosis not present

## 2023-09-25 DIAGNOSIS — Z1212 Encounter for screening for malignant neoplasm of rectum: Secondary | ICD-10-CM | POA: Diagnosis not present

## 2023-09-25 DIAGNOSIS — E785 Hyperlipidemia, unspecified: Secondary | ICD-10-CM | POA: Diagnosis not present

## 2023-10-02 DIAGNOSIS — Z1212 Encounter for screening for malignant neoplasm of rectum: Secondary | ICD-10-CM | POA: Diagnosis not present

## 2023-10-02 DIAGNOSIS — E785 Hyperlipidemia, unspecified: Secondary | ICD-10-CM | POA: Diagnosis not present

## 2023-10-03 DIAGNOSIS — Z1339 Encounter for screening examination for other mental health and behavioral disorders: Secondary | ICD-10-CM | POA: Diagnosis not present

## 2023-10-03 DIAGNOSIS — R7301 Impaired fasting glucose: Secondary | ICD-10-CM | POA: Diagnosis not present

## 2023-10-03 DIAGNOSIS — N400 Enlarged prostate without lower urinary tract symptoms: Secondary | ICD-10-CM | POA: Diagnosis not present

## 2023-10-03 DIAGNOSIS — Z8249 Family history of ischemic heart disease and other diseases of the circulatory system: Secondary | ICD-10-CM | POA: Diagnosis not present

## 2023-10-03 DIAGNOSIS — I10 Essential (primary) hypertension: Secondary | ICD-10-CM | POA: Diagnosis not present

## 2023-10-03 DIAGNOSIS — M1990 Unspecified osteoarthritis, unspecified site: Secondary | ICD-10-CM | POA: Diagnosis not present

## 2023-10-03 DIAGNOSIS — L989 Disorder of the skin and subcutaneous tissue, unspecified: Secondary | ICD-10-CM | POA: Diagnosis not present

## 2023-10-03 DIAGNOSIS — R82998 Other abnormal findings in urine: Secondary | ICD-10-CM | POA: Diagnosis not present

## 2023-10-03 DIAGNOSIS — E669 Obesity, unspecified: Secondary | ICD-10-CM | POA: Diagnosis not present

## 2023-10-03 DIAGNOSIS — E785 Hyperlipidemia, unspecified: Secondary | ICD-10-CM | POA: Diagnosis not present

## 2023-10-03 DIAGNOSIS — G4700 Insomnia, unspecified: Secondary | ICD-10-CM | POA: Diagnosis not present

## 2023-10-03 DIAGNOSIS — M5441 Lumbago with sciatica, right side: Secondary | ICD-10-CM | POA: Diagnosis not present

## 2023-10-03 DIAGNOSIS — Z1331 Encounter for screening for depression: Secondary | ICD-10-CM | POA: Diagnosis not present

## 2023-10-03 DIAGNOSIS — Z0000 Encounter for general adult medical examination without abnormal findings: Secondary | ICD-10-CM | POA: Diagnosis not present

## 2023-10-03 DIAGNOSIS — R0989 Other specified symptoms and signs involving the circulatory and respiratory systems: Secondary | ICD-10-CM | POA: Diagnosis not present

## 2023-10-08 DIAGNOSIS — H52223 Regular astigmatism, bilateral: Secondary | ICD-10-CM | POA: Diagnosis not present

## 2024-03-31 DIAGNOSIS — E785 Hyperlipidemia, unspecified: Secondary | ICD-10-CM | POA: Diagnosis not present

## 2024-03-31 DIAGNOSIS — G4700 Insomnia, unspecified: Secondary | ICD-10-CM | POA: Diagnosis not present

## 2024-03-31 DIAGNOSIS — R0989 Other specified symptoms and signs involving the circulatory and respiratory systems: Secondary | ICD-10-CM | POA: Diagnosis not present

## 2024-03-31 DIAGNOSIS — F17200 Nicotine dependence, unspecified, uncomplicated: Secondary | ICD-10-CM | POA: Diagnosis not present

## 2024-03-31 DIAGNOSIS — N400 Enlarged prostate without lower urinary tract symptoms: Secondary | ICD-10-CM | POA: Diagnosis not present

## 2024-03-31 DIAGNOSIS — M1990 Unspecified osteoarthritis, unspecified site: Secondary | ICD-10-CM | POA: Diagnosis not present

## 2024-03-31 DIAGNOSIS — Z8249 Family history of ischemic heart disease and other diseases of the circulatory system: Secondary | ICD-10-CM | POA: Diagnosis not present

## 2024-03-31 DIAGNOSIS — R7301 Impaired fasting glucose: Secondary | ICD-10-CM | POA: Diagnosis not present

## 2024-03-31 DIAGNOSIS — M79644 Pain in right finger(s): Secondary | ICD-10-CM | POA: Diagnosis not present

## 2024-03-31 DIAGNOSIS — M5441 Lumbago with sciatica, right side: Secondary | ICD-10-CM | POA: Diagnosis not present

## 2024-03-31 DIAGNOSIS — I10 Essential (primary) hypertension: Secondary | ICD-10-CM | POA: Diagnosis not present

## 2024-03-31 DIAGNOSIS — E669 Obesity, unspecified: Secondary | ICD-10-CM | POA: Diagnosis not present

## 2024-04-08 DIAGNOSIS — D485 Neoplasm of uncertain behavior of skin: Secondary | ICD-10-CM | POA: Diagnosis not present

## 2024-04-08 DIAGNOSIS — C44519 Basal cell carcinoma of skin of other part of trunk: Secondary | ICD-10-CM | POA: Diagnosis not present

## 2024-05-27 DIAGNOSIS — C44519 Basal cell carcinoma of skin of other part of trunk: Secondary | ICD-10-CM | POA: Diagnosis not present

## 2024-05-27 DIAGNOSIS — Z85828 Personal history of other malignant neoplasm of skin: Secondary | ICD-10-CM | POA: Diagnosis not present

## 2024-11-04 ENCOUNTER — Other Ambulatory Visit (HOSPITAL_COMMUNITY): Payer: Self-pay | Admitting: Internal Medicine

## 2024-11-04 ENCOUNTER — Ambulatory Visit (HOSPITAL_COMMUNITY)
Admission: RE | Admit: 2024-11-04 | Discharge: 2024-11-04 | Disposition: A | Discharge status: Home / Self Care | Payer: Self-pay | Source: Ambulatory Visit | Attending: Vascular Surgery | Admitting: Vascular Surgery

## 2024-11-04 DIAGNOSIS — R0989 Other specified symptoms and signs involving the circulatory and respiratory systems: Secondary | ICD-10-CM | POA: Insufficient documentation

## 2025-01-14 ENCOUNTER — Ambulatory Visit (HOSPITAL_BASED_OUTPATIENT_CLINIC_OR_DEPARTMENT_OTHER): Admitting: Cardiovascular Disease
